# Patient Record
Sex: Female | Born: 1990 | Race: Black or African American | Hispanic: No | Marital: Single | Smoking: Never smoker
Health system: Southern US, Community
[De-identification: ages and names within clinical notes are randomized; demographics above are authoritative.]

## PROBLEM LIST (undated history)

## (undated) DIAGNOSIS — E669 Obesity, unspecified: Secondary | ICD-10-CM

---
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
## 2012-07-04 ENCOUNTER — Other Ambulatory Visit (HOSPITAL_COMMUNITY)
Admission: RE | Admit: 2012-07-04 | Discharge: 2012-07-04 | Disposition: A | Discharge status: Home / Self Care | Payer: Self-pay | Source: Ambulatory Visit | Attending: Family Medicine | Admitting: Family Medicine

## 2012-07-04 ENCOUNTER — Encounter (HOSPITAL_COMMUNITY): Payer: Self-pay | Admitting: Emergency Medicine

## 2012-07-04 ENCOUNTER — Emergency Department (INDEPENDENT_AMBULATORY_CARE_PROVIDER_SITE_OTHER)
Admission: EM | Admit: 2012-07-04 | Discharge: 2012-07-04 | Disposition: A | Discharge status: Home / Self Care | Payer: Self-pay | Source: Home / Self Care | Attending: Family Medicine | Admitting: Family Medicine

## 2012-07-04 DIAGNOSIS — B373 Candidiasis of vulva and vagina: Secondary | ICD-10-CM

## 2012-07-04 DIAGNOSIS — Z113 Encounter for screening for infections with a predominantly sexual mode of transmission: Secondary | ICD-10-CM | POA: Insufficient documentation

## 2012-07-04 DIAGNOSIS — N760 Acute vaginitis: Secondary | ICD-10-CM | POA: Insufficient documentation

## 2012-07-04 LAB — POCT URINALYSIS DIP (DEVICE)
Bilirubin Urine: NEGATIVE
Glucose, UA: NEGATIVE mg/dL
Hgb urine dipstick: NEGATIVE
Ketones, ur: NEGATIVE mg/dL
Specific Gravity, Urine: 1.02 (ref 1.005–1.030)

## 2012-07-04 LAB — GLUCOSE, CAPILLARY: Glucose-Capillary: 79 mg/dL (ref 70–99)

## 2012-07-04 MED ORDER — TERCONAZOLE 80 MG VA SUPP: 80.0000 mg | Freq: Every day | VAGINAL | Status: DC

## 2012-07-04 MED ORDER — FLUCONAZOLE 150 MG PO TABS: 150.0000 mg | ORAL_TABLET | Freq: Once | ORAL | Status: DC

## 2012-07-04 NOTE — ED Provider Notes (Signed)
History     CSN: [id number]  Arrival date & time 07/04/12  1620   First MD Initiated Contact with Patient 07/04/12 1642      Chief Complaint  Patient presents with  . Exposure to STD    unprotected intercourse  . Vaginal Discharge    vaginal irritation    (Consider location/radiation/quality/duration/timing/severity/associated sxs/prior treatment) Patient is a 22 y.o. female presenting with STD exposure and vaginal discharge. The history is provided by the patient.  Exposure to STD This is a new problem. The current episode started more than 2 days ago. The problem has been gradually worsening. Associated symptoms include abdominal pain.  Vaginal Discharge This is a new problem. The current episode started more than 2 days ago. The problem has been gradually worsening. Associated symptoms include abdominal pain.    History reviewed. No pertinent past medical history.  History reviewed. No pertinent past surgical history.  History reviewed. No pertinent family history.  History  Substance Use Topics  . Smoking status: Never Smoker   . Smokeless tobacco: Not on file  . Alcohol Use: Yes    OB History   Grav Para Term Preterm Abortions TAB SAB Ect Mult Living                  Review of Systems  Constitutional: Negative.   Gastrointestinal: Positive for abdominal pain. Negative for nausea, vomiting and diarrhea.  Genitourinary: Positive for vaginal discharge. Negative for vaginal bleeding, vaginal pain and pelvic pain.    Allergies  Review of patient's allergies indicates no known allergies.  Home Medications   Current Outpatient Rx  Name  Route  Sig  Dispense  Refill  . fluconazole (DIFLUCAN) 150 MG tablet   Oral   Take 1 tablet (150 mg total) by mouth once. May repeat in 1 week.   1 tablet   1   . terconazole (TERAZOL 3) 80 MG vaginal suppository   Vaginal   Place 1 suppository (80 mg total) vaginally at bedtime.   3 suppository   0     LMP  05/06/2012  Physical Exam  Nursing note and vitals reviewed. Constitutional: She is oriented to person, place, and time. She appears well-developed and well-nourished.  HENT:  Head: Normocephalic.  Abdominal: Soft. Bowel sounds are normal.  Genitourinary: Uterus normal.    Cervix exhibits no discharge. Right adnexum displays no mass and no tenderness. Left adnexum displays no mass and no tenderness. There is erythema around the vagina. No tenderness or bleeding around the vagina. Vaginal discharge found.  Neurological: She is alert and oriented to person, place, and time.  Skin: Skin is warm and dry.    ED Course  Procedures (including critical care time)  Labs Reviewed  POCT URINALYSIS DIP (DEVICE) - Abnormal; Notable for the following:    Urobilinogen, UA 4.0 (*)    Leukocytes, UA SMALL (*)    All other components within normal limits  GLUCOSE, CAPILLARY  POCT PREGNANCY, URINE  POCT PREGNANCY, URINE  CERVICOVAGINAL ANCILLARY ONLY   No results found.   1. Candida vaginitis       MDM          Linna Hoff, MD 07/05/12 [phone]

## 2012-07-04 NOTE — ED Notes (Signed)
Pt c/o vaginal irritation with a white discharge and denies odor. Pelvic and abdominal pain off/on. Pt request upt and std testing. Pt states that she cant remember date of last cycle

## 2012-07-05 ENCOUNTER — Telehealth (HOSPITAL_COMMUNITY): Payer: Self-pay | Admitting: *Deleted

## 2012-07-05 LAB — POCT PREGNANCY, URINE: Preg Test, Ur: NEGATIVE

## 2012-07-05 MED ORDER — AZITHROMYCIN 500 MG PO TABS: 1000.0000 mg | ORAL_TABLET | Freq: Once | ORAL | Status: DC

## 2012-07-05 NOTE — ED Notes (Signed)
I called pt.  Pt. verified x 2 and given results.  Pt. told she needs Zithromax for Chlamydia. I told her the vaginitis test is not back, but I would call her if anything else is positive.  Pt. instructed to notify her partner, no sex for 1 week and to practice safe sex. Pt. told they can get HIV testing at the [facility]D clinic, by appointment.  Rx. called to the [facility] on [address]. @ [phone].  DHHS form completed and faxed to the [facility].

## 2012-07-10 NOTE — ED Notes (Signed)
3/7 Affirm: Candida and Gardnerella pos. Trich neg. Pt. adequately treated with Diflucan for Candida.   Discussed results with Dr. Artis Flock and he said he was not going to treat the Gardnerella.   Cherly Anderson M

## 2012-07-11 ENCOUNTER — Telehealth (HOSPITAL_COMMUNITY): Payer: Self-pay | Admitting: *Deleted

## 2012-07-11 NOTE — ED Notes (Signed)
Pt. verified x 2 and given Affirm results. Pt. told she was adequately treated with Diflucan and Terazol supp. for Candida.  Pt. said her symptoms are gone. No vaginal discharge or itching now. Pt. told she does not need tx. for Gardnerella at this time since no symptoms.  Pt. instructed to get rechecked if symptoms recur. Pt. voiced understanding. Andrea Gill 07/11/2012

## 2012-08-22 ENCOUNTER — Encounter (HOSPITAL_COMMUNITY): Payer: Self-pay | Admitting: Nurse Practitioner

## 2012-08-22 ENCOUNTER — Emergency Department (HOSPITAL_COMMUNITY)
Admission: EM | Admit: 2012-08-22 | Discharge: 2012-08-22 | Disposition: A | Discharge status: Home / Self Care | Payer: Self-pay | Attending: Emergency Medicine | Admitting: Emergency Medicine

## 2012-08-22 DIAGNOSIS — K089 Disorder of teeth and supporting structures, unspecified: Secondary | ICD-10-CM | POA: Insufficient documentation

## 2012-08-22 DIAGNOSIS — K0889 Other specified disorders of teeth and supporting structures: Secondary | ICD-10-CM

## 2012-08-22 MED ORDER — AMOXICILLIN 500 MG PO CAPS: 1000.0000 mg | ORAL_CAPSULE | Freq: Two times a day (BID) | ORAL | Status: DC

## 2012-08-22 MED ORDER — HYDROCODONE-ACETAMINOPHEN 5-325 MG PO TABS: 1.0000 | ORAL_TABLET | ORAL | Status: DC | PRN

## 2012-08-22 MED ORDER — IBUPROFEN 800 MG PO TABS: 800.0000 mg | ORAL_TABLET | Freq: Three times a day (TID) | ORAL | Status: DC

## 2012-08-22 NOTE — ED Notes (Addendum)
Pt reports L and R sided toothache for past months, mild at first but no severe and radiating through jaw. Took otc meds with no relief

## 2012-08-22 NOTE — ED Provider Notes (Signed)
History    This chart was scribed for non-physician practitioner Elpidio Anis, PA-C working with Celene Kras, MD by Gerlean Ren, ED Scribe. This patient was seen in room TR11C/TR11C and the patient's care was started at 5:04 PM.   CSN: [id number]  Arrival date & time 08/22/12  1421   First MD Initiated Contact with Patient 08/22/12 1641      Chief Complaint  Patient presents with  . Dental Pain     The history is provided by the patient. No language interpreter was used.  Andrea Gill is a 22 y.o. female who presents to the Emergency Department complaining of left and right side severe dental pain that began worsening 4 days ago and is currently severe.  Pain has been mild and waxing-and-waning over the past 3 months.  Pt denies any fevers, chills, nausea, trouble swallowing.  Pt used OCM without relief to pain.      History reviewed. No pertinent past medical history.  History reviewed. No pertinent past surgical history.  History reviewed. No pertinent family history.  History  Substance Use Topics  . Smoking status: Never Smoker   . Smokeless tobacco: Not on file  . Alcohol Use: Yes    No OB history provided.   Review of Systems  Constitutional: Negative for fever and chills.  HENT: Positive for dental problem. Negative for facial swelling and trouble swallowing.   Gastrointestinal: Negative for nausea.  All other systems reviewed and are negative.    Allergies  Review of patient's allergies indicates no known allergies.  Home Medications   Current Outpatient Rx  Name  Route  Sig  Dispense  Refill  . acetaminophen (TYLENOL) 500 MG tablet   Oral   Take 1,000-1,500 mg by mouth every 4 (four) hours as needed for pain.         Josefa Half Peroxide (ORAJEL MOUTH-AID MT)   Mouth/Throat   Use as directed 1 application in the mouth or throat daily as needed (for tooth pain).         . cetirizine (ZYRTEC) 10 MG tablet   Oral   Take 10 mg by mouth  daily.         . diphenhydrAMINE (BENADRYL) 25 MG tablet   Oral   Take 25 mg by mouth every 6 (six) hours as needed for allergies.          Marland Kitchen ibuprofen (ADVIL,MOTRIN) 200 MG tablet   Oral   Take 600 mg by mouth every 4 (four) hours as needed for pain.         . Pseudoephedrine-APAP-DM (DAYQUIL MULTI-SYMPTOM COLD/FLU PO)   Oral   Take 1 tablet by mouth daily as needed (for cold).           BP 159/84  Pulse 90  Temp(Src) 97.9 F (36.6 C) (Oral)  Resp 14  SpO2 99%  Physical Exam  Nursing note and vitals reviewed. Constitutional: She is oriented to person, place, and time. She appears well-developed and well-nourished. No distress.  HENT:  Head: Normocephalic and atraumatic.  Eyes: EOM are normal.  Neck: Neck supple. No tracheal deviation present.  No adenopathy   Cardiovascular: Normal rate.   Pulmonary/Chest: Effort normal. No respiratory distress.  Musculoskeletal: Normal range of motion.  Neurological: She is alert and oriented to person, place, and time.  Skin: Skin is warm and dry.  Psychiatric: She has a normal mood and affect. Her behavior is normal.    ED Course  Procedures (including  critical care time) DIAGNOSTIC STUDIES: Oxygen Saturation is 99% on room air, normal by my interpretation.    COORDINATION OF CARE: 5:07 PM- Discussed pain treatment and antibiotics but that dentist appointment is necessary.  Pt verbalizes understanding and agrees with plan.    No diagnosis found.  1. Dental pain   MDM  Will treat with abx and pain relief. Refer to dentistry.      I personally performed the services described in this documentation, which was scribed in my presence. The recorded information has been reviewed and is accurate.     Arnoldo Hooker, PA-C 08/22/12 1722

## 2012-08-22 NOTE — ED Provider Notes (Signed)
Medical screening examination/treatment/procedure(s) were performed by non-physician practitioner and as supervising physician I was immediately available for consultation/collaboration.    Princessa Lesmeister R Jaleeyah Munce, MD 08/22/12 2344 

## 2012-10-31 ENCOUNTER — Emergency Department (HOSPITAL_COMMUNITY)
Admission: EM | Admit: 2012-10-31 | Discharge: 2012-10-31 | Disposition: A | Discharge status: Home / Self Care | Payer: Self-pay | Attending: Emergency Medicine | Admitting: Emergency Medicine

## 2012-10-31 ENCOUNTER — Encounter (HOSPITAL_COMMUNITY): Payer: Self-pay | Admitting: Emergency Medicine

## 2012-10-31 DIAGNOSIS — K029 Dental caries, unspecified: Secondary | ICD-10-CM | POA: Insufficient documentation

## 2012-10-31 DIAGNOSIS — K0889 Other specified disorders of teeth and supporting structures: Secondary | ICD-10-CM

## 2012-10-31 DIAGNOSIS — K089 Disorder of teeth and supporting structures, unspecified: Secondary | ICD-10-CM | POA: Insufficient documentation

## 2012-10-31 MED ORDER — PENICILLIN V POTASSIUM 500 MG PO TABS: 500.0000 mg | ORAL_TABLET | Freq: Three times a day (TID) | ORAL | Status: DC

## 2012-10-31 MED ORDER — NAPROXEN 500 MG PO TABS: 500.0000 mg | ORAL_TABLET | Freq: Two times a day (BID) | ORAL | Status: DC

## 2012-10-31 MED ORDER — HYDROCODONE-ACETAMINOPHEN 5-325 MG PO TABS: ORAL_TABLET | ORAL | Status: DC

## 2012-10-31 NOTE — ED Provider Notes (Signed)
History    CSN: [id number] Arrival date & time 10/31/12  1356  First MD Initiated Contact with Patient 10/31/12 1435     Chief Complaint  Patient presents with  . Dental Pain   (Consider location/radiation/quality/duration/timing/severity/associated sxs/prior Treatment) HPI Comments: Patient with L mandibular 1st molar pain x 2 days. Worsening. No facial swelling. Over-the-counter medications without relief. No difficulty breathing or trouble swallowing. Onset of symptoms gradual. Nothing makes symptoms better or worse.  Patient is a 22 y.o. female presenting with tooth pain. The history is provided by the patient.  Dental Pain Associated symptoms: no facial swelling, no fever, no headaches and no neck pain    History reviewed. No pertinent past medical history. History reviewed. No pertinent past surgical history. History reviewed. No pertinent family history. History  Substance Use Topics  . Smoking status: Never Smoker   . Smokeless tobacco: Not on file  . Alcohol Use: Yes   OB History   Grav Para Term Preterm Abortions TAB SAB Ect Mult Living                 Review of Systems  Constitutional: Negative for fever.  HENT: Positive for dental problem. Negative for ear pain, sore throat, facial swelling, trouble swallowing and neck pain.   Respiratory: Negative for shortness of breath and stridor.   Skin: Negative for color change.  Neurological: Negative for headaches.    Allergies  Apple; Banana; and Other  Home Medications   Current Outpatient Rx  Name  Route  Sig  Dispense  Refill  . acetaminophen (TYLENOL) 500 MG tablet   Oral   Take 1,000-1,500 mg by mouth every 4 (four) hours as needed for pain.         Josefa Half Peroxide (ORAJEL MOUTH-AID MT)   Mouth/Throat   Use as directed 1 application in the mouth or throat daily as needed (for tooth pain).         . cetirizine (ZYRTEC) 10 MG tablet   Oral   Take 10 mg by mouth daily.         .  diphenhydrAMINE (BENADRYL) 25 MG tablet   Oral   Take 25 mg by mouth every 6 (six) hours as needed for allergies.          Marland Kitchen ibuprofen (ADVIL,MOTRIN) 200 MG tablet   Oral   Take 600 mg by mouth every 4 (four) hours as needed for pain.          BP 123/80  Pulse 86  Temp(Src) 98 F (36.7 C) (Oral)  Resp 16  SpO2 100% Physical Exam  Nursing note and vitals reviewed. Constitutional: She appears well-developed and well-nourished.  HENT:  Head: Normocephalic and atraumatic. No trismus in the jaw.  Right Ear: Tympanic membrane, external ear and ear canal normal.  Left Ear: Tympanic membrane, external ear and ear canal normal.  Nose: Nose normal.  Mouth/Throat: Uvula is midline, oropharynx is clear and moist and mucous membranes are normal. Abnormal dentition. Dental caries present. No dental abscesses or edematous. No tonsillar abscesses.  Patient with L mandibular tooth pain and tenderness to palpation in area of L 1st molar. No swelling or erythema noted on exam.  Eyes: Conjunctivae are normal.  Neck: Normal range of motion. Neck supple.  No neck swelling or Ludwig's angina  Lymphadenopathy:    She has no cervical adenopathy.  Neurological: She is alert.  Skin: Skin is warm and dry.  Psychiatric: She has a normal mood and  affect.    ED Course  Procedures (including critical care time) Labs Reviewed - No data to display No results found. 1. Pain, dental     2:40 PM Patient seen and examined. Work-up initiated. Medications ordered.   Vital signs reviewed and are as follows: Filed Vitals:   10/31/12 1407  BP: 123/80  Pulse: 86  Temp: 98 F (36.7 C)  Resp: 16    Patient counseled to take prescribed medications as directed, return with worsening facial or neck swelling, and to follow-up with their dentist as soon as possible.    MDM  Patient with toothache.  No gross abscess.  Exam unconcerning for Ludwig's angina or other deep tissue infection in neck.  Will treat  with pain medicine and NSAID.  Penicilin rx in case no improvement or facial swelling develops. Urged patient to follow-up with dentist.  Referrals given.   Renne Crigler, PA-C 10/31/12 1515

## 2012-10-31 NOTE — ED Notes (Signed)
Pt c/o left lower and right upper dental pain; pt seen here for same in past

## 2012-10-31 NOTE — ED Provider Notes (Signed)
Medical screening examination/treatment/procedure(s) were performed by non-physician practitioner and as supervising physician I was immediately available for consultation/collaboration.  Rylah Fukuda, MD 10/31/12 2120 

## 2012-10-31 NOTE — ED Notes (Signed)
PA at bedside.

## 2013-12-30 ENCOUNTER — Encounter (HOSPITAL_COMMUNITY): Payer: Self-pay | Admitting: Emergency Medicine

## 2013-12-30 ENCOUNTER — Emergency Department (HOSPITAL_COMMUNITY)
Admission: EM | Admit: 2013-12-30 | Discharge: 2013-12-30 | Disposition: A | Discharge status: Home / Self Care | Payer: Self-pay | Attending: Emergency Medicine | Admitting: Emergency Medicine

## 2013-12-30 DIAGNOSIS — E669 Obesity, unspecified: Secondary | ICD-10-CM | POA: Insufficient documentation

## 2013-12-30 DIAGNOSIS — R21 Rash and other nonspecific skin eruption: Secondary | ICD-10-CM | POA: Insufficient documentation

## 2013-12-30 DIAGNOSIS — B86 Scabies: Secondary | ICD-10-CM | POA: Insufficient documentation

## 2013-12-30 HISTORY — DX: Obesity, unspecified: E66.9

## 2013-12-30 MED ORDER — PERMETHRIN 5 % EX CREA: TOPICAL_CREAM | CUTANEOUS | Status: DC

## 2013-12-30 MED ORDER — HYDROXYZINE HCL 25 MG PO TABS: 25.0000 mg | ORAL_TABLET | Freq: Four times a day (QID) | ORAL | Status: DC | PRN

## 2013-12-30 MED ORDER — TRIAMCINOLONE ACETONIDE 0.1 % EX CREA: 1.0000 "application " | TOPICAL_CREAM | Freq: Three times a day (TID) | CUTANEOUS | Status: DC | PRN

## 2013-12-30 NOTE — ED Notes (Signed)
PT reports rash started  2 months ago.

## 2013-12-30 NOTE — ED Provider Notes (Signed)
CSN: [id number]     Arrival date & time 12/30/13  1817 History  This chart was scribed for non-physician practitioner, Andrea Derry, PA-C working with Andrea Crease, MD by Andrea Gill, ED scribe. This patient was seen in room TR06C/TR06C and the patient's care was started at 7:57 PM.   Chief Complaint  Patient presents with  . Rash   Patient is a 23 y.o. female presenting with rash. The history is provided by the patient. No language interpreter was used.  Rash Location:  Hand, foot and shoulder/arm Shoulder/arm rash location:  L forearm, R forearm, L elbow and R elbow Hand rash location:  R hand and L hand Foot rash location:  L foot and R foot Quality: blistering, itchiness, scaling and weeping (when "squeezed")   Quality: not red   Severity:  Mild Onset quality:  Gradual Duration:  2 months Timing:  Constant Progression:  Spreading Chronicity:  New Context: new detergent/soap (new "black bar" soap)   Context: not animal contact, not exposure to similar rash, not food, not hot tub use, not insect bite/sting, not medications, not nuts, not plant contact, not pollen and not sick contacts   Relieved by:  Nothing Worsened by:  Nothing tried Ineffective treatments: Aveeno eczema cream. Associated symptoms: no abdominal pain, no fever, no induration, no joint pain, no myalgias, no nausea, no shortness of breath, no sore throat, no throat swelling, no tongue swelling, no URI, not vomiting and not wheezing    HPI Comments: Andrea Gill is a 23 y.o. female with history of eczema who presents to the Emergency Department complaining of an itchy rash to bilateral hands, arms and feet that started about 2 months ago. States it started on her toes and moved up her body. She doesn't think it ever got red or swollen. Reports a clear drainage from the rash intermittently when squeezed. Rash is worse at night. Pt thought it was an eczema flare up but states her sister now has  a similar rash. She has used Aveeno eczema with little relief. Pt started using a new soap and stayed in a hotel before the rash started. Denies new lotions, detergents, clothes, pets, furniture. Denies fever, tongue swelling, lip swelling, SOB, cough, wheezing, abdominal pain, nausea, emesis.   Past Medical History  Diagnosis Date  . Obesity    History reviewed. No pertinent past surgical history. History reviewed. No pertinent family history. History  Substance Use Topics  . Smoking status: Never Smoker   . Smokeless tobacco: Not on file  . Alcohol Use: Yes   OB History   Grav Para Term Preterm Abortions TAB SAB Ect Mult Living                 Review of Systems  Constitutional: Negative for fever.  HENT: Negative for facial swelling and sore throat.   Respiratory: Negative for cough, shortness of breath and wheezing.   Cardiovascular: Negative for chest pain.  Gastrointestinal: Negative for nausea, vomiting and abdominal pain.  Musculoskeletal: Negative for arthralgias, joint swelling, myalgias, neck pain and neck stiffness.  Skin: Positive for rash. Negative for color change.  Neurological: Negative for numbness.  10 Systems reviewed and are negative for acute change except as noted in the HPI.  Allergies  Apple; Banana; and Other  Home Medications   Prior to Admission medications   Medication Sig Start Date End Date Taking? Authorizing Provider  hydrOXYzine (ATARAX/VISTARIL) 25 MG tablet Take 1 tablet (25 mg total) by mouth every 6 (  six) hours as needed for itching. 12/30/13   Andrea Yono Strupp Camprubi-Soms, PA-C  permethrin (ELIMITE) 5 % cream Apply a generous amount of cream from head to feet, leave on for 8 to 14 hours, then wash off with soap/water 12/30/13   Andrea Falls Camprubi-Soms, PA-C  triamcinolone cream (KENALOG) 0.1 % Apply 1 application topically 3 (three) times daily as needed. For itching 12/30/13   Andrea Graul Strupp Camprubi-Soms, PA-C   BP 120/83  Pulse  83  Temp(Src) 98.7 F (37.1 C) (Oral)  Resp 18  SpO2 100%  LMP 11/07/2013  Physical Exam  Nursing note and vitals reviewed. Constitutional: She is oriented to person, place, and time. Vital signs are normal. She appears well-developed and well-nourished. No distress.  Afebrile nontoxic NAD  HENT:  Head: Normocephalic and atraumatic.  Mouth/Throat: Mucous membranes are normal.  Eyes: Conjunctivae and EOM are normal.  Neck: Normal range of motion. Neck supple.  Cardiovascular: Normal rate.   Pulmonary/Chest: Effort normal. No stridor. No respiratory distress.  Abdominal: Normal appearance. She exhibits no distension.  Musculoskeletal: Normal range of motion.  Neurological: She is alert and oriented to person, place, and time. She has normal strength. No sensory deficit.  Skin: Skin is warm, dry and intact. Rash noted. Rash is pustular.  Somewhat pustular rash over entire body with most prominent areas in interdigital web spaces, +burrowing. No erythema or drainage. No vesicles. No scaling.   Psychiatric: She has a normal mood and affect. Her behavior is normal.    ED Course  Procedures (including critical care time)  DIAGNOSTIC STUDIES: Oxygen Saturation is 100% on RA, normal by my interpretation.    COORDINATION OF CARE: 8:05 PM-Discussed treatment plan which includes permethrin cream with pt at bedside and pt agreed to plan. Advised pt to wash her clothes and bedding in hot water.   Labs Review Labs Reviewed - No data to display  Imaging Review No results found.   EKG Interpretation None      MDM   Final diagnoses:  Scabies  Rash    23y/o female with rash, appears like scabies, sister here for same. Interdigital web spaces affected, worse at night, and +burrowing seen. Given triamcinolone, vistaril, and permethrin. Discussed washing linens in hot water and enveloping fabrics x48hrs. Discussed f/up with PCP from resource guide. I explained the diagnosis and have  given explicit precautions to return to the ER including for any other new or worsening symptoms. The patient understands and accepts the medical plan as it's been dictated and I have answered their questions. Discharge instructions concerning home care and prescriptions have been given. The patient is STABLE and is discharged to home in good condition.   I personally performed the services described in this documentation, which was scribed in my presence. The recorded information has been reviewed and is accurate.  BP 120/83  Pulse 83  Temp(Src) 98.7 F (37.1 C) (Oral)  Resp 18  SpO2 100%  LMP 11/07/2013  Meds ordered this encounter  Medications  . triamcinolone cream (KENALOG) 0.1 %    Sig: Apply 1 application topically 3 (three) times daily as needed. For itching    Dispense:  30 g    Refill:  0    Order Specific Question:  Supervising Provider    Answer:  Eber Hong D [[id number]]  . hydrOXYzine (ATARAX/VISTARIL) 25 MG tablet    Sig: Take 1 tablet (25 mg total) by mouth every 6 (six) hours as needed for itching.    Dispense:  28 tablet    Refill:  0    Order Specific Question:  Supervising Provider    Answer:  Eber Hong D [[id number]]  . permethrin (ELIMITE) 5 % cream    Sig: Apply a generous amount of cream from head to feet, leave on for 8 to 14 hours, then wash off with soap/water    Dispense:  60 g    Refill:  0    Order Specific Question:  Supervising Provider    Answer:  Vida Roller [address] [place] Garrie Elenes Camprubi-Soms, PA-C 12/30/13 2049

## 2013-12-30 NOTE — Discharge Instructions (Signed)
Take vistaril for itching, and use triamcinolone cream as directed for itching, as well as apply permethrin lotion as prescribed to eradicate your scabies infection. Continue your usual home medications. Get plenty of rest and drink plenty of fluids. You must wash all linens with hot water, and wrap any fabric items that can't be washed in a plastic bag with a seal for 48hrs. All family members will need to do this. Please followup with your primary doctor/a doctor from the list below for discussion of your diagnoses and further evaluation after today's visit; if you do not have a primary care doctor use the resource guide provided to find one; return to the ER for worsening or changes in symptoms.   Rash A rash is a change in the color or texture of your skin. There are many different types of rashes. You may have other problems that accompany your rash. CAUSES   Infections.  Allergic reactions. This can include allergies to pets or foods.  Certain medicines.  Exposure to certain chemicals, soaps, or cosmetics.  Heat.  Exposure to poisonous plants.  Tumors, both cancerous and noncancerous. SYMPTOMS   Redness.  Scaly skin.  Itchy skin.  Dry or cracked skin.  Bumps.  Blisters.  Pain. DIAGNOSIS  Your caregiver may do a physical exam to determine what type of rash you have. A skin sample (biopsy) may be taken and examined under a microscope. TREATMENT  Treatment depends on the type of rash you have. Your caregiver may prescribe certain medicines. For serious conditions, you may need to see a skin doctor (dermatologist). HOME CARE INSTRUCTIONS   Avoid the substance that caused your rash.  Do not scratch your rash. This can cause infection.  You may take cool baths to help stop itching.  Only take over-the-counter or prescription medicines as directed by your caregiver.  Keep all follow-up appointments as directed by your caregiver. SEEK IMMEDIATE MEDICAL CARE IF:  You  have increasing pain, swelling, or redness.  You have a fever.  You have new or severe symptoms.  You have body aches, diarrhea, or vomiting.  Your rash is not better after 3 days. MAKE SURE YOU:  Understand these instructions.  Will watch your condition.  Will get help right away if you are not doing well or get worse. Document Released: 04/09/2002 Document Revised: 07/12/2011 Document Reviewed: 02/01/2011 [facility] Patient Information 2015 [place], [place]. This information is not intended to replace advice given to you by your health care provider. Make sure you discuss any questions you have with your health care provider.  Scabies Scabies are small bugs (mites) that burrow under the skin and cause red bumps and severe itching. These bugs can only be seen with a microscope. Scabies are highly contagious. They can spread easily from person to person by direct contact. They are also spread through sharing clothing or linens that have the scabies mites living in them. It is not unusual for an entire family to become infected through shared towels, clothing, or bedding.  HOME CARE INSTRUCTIONS   Your caregiver may prescribe a cream or lotion to kill the mites. If cream is prescribed, massage the cream into the entire body from the neck to the bottom of both feet. Also massage the cream into the scalp and face if your child is less than 20 year old. Avoid the eyes and mouth. Do not wash your hands after application.  Leave the cream on for 8 to 12 hours. Your child should bathe or  shower after the 8 to 12 hour application period. Sometimes it is helpful to apply the cream to your child right before bedtime.  One treatment is usually effective and will eliminate approximately 95% of infestations. For severe cases, your caregiver may decide to repeat the treatment in 1 week. Everyone in your household should be treated with one application of the cream.  New rashes or burrows should not appear  within 24 to 48 hours after successful treatment. However, the itching and rash may last for 2 to 4 weeks after successful treatment. Your caregiver may prescribe a medicine to help with the itching or to help the rash go away more quickly.  Scabies can live on clothing or linens for up to 3 days. All of your child's recently used clothing, towels, stuffed toys, and bed linens should be washed in hot water and then dried in a dryer for at least 20 minutes on high heat. Items that cannot be washed should be enclosed in a plastic bag for at least 3 days.  To help relieve itching, bathe your child in a cool bath or apply cool washcloths to the affected areas.  Your child may return to school after treatment with the prescribed cream. SEEK MEDICAL CARE IF:   The itching persists longer than 4 weeks after treatment.  The rash spreads or becomes infected. Signs of infection include red blisters or yellow-tan crust. Document Released: 04/19/2005 Document Revised: 07/12/2011 Document Reviewed: 08/28/2008 [facility] Patient Information 2015 [place], [place]. This information is not intended to replace advice given to you by your health care provider. Make sure you discuss any questions you have with your health care provider.  Emergency Department Resource Guide 1) Find a Doctor and Pay Out of Pocket Although you won't have to find out who is covered by your insurance plan, it is a good idea to ask around and get recommendations. You will then need to call the office and see if the doctor you have chosen will accept you as a new patient and what types of options they offer for patients who are self-pay. Some doctors offer discounts or will set up payment plans for their patients who do not have insurance, but you will need to ask so you aren't surprised when you get to your appointment.  2) Contact Your Local Health Department Not all health departments have doctors that can see patients for sick visits, but  many do, so it is worth a call to see if yours does. If you don't know where your local health department is, you can check in your phone book. The CDC also has a tool to help you locate your state's health department, and many state websites also have listings of all of their local health departments.  3) Find a Walk-in Clinic If your illness is not likely to be very severe or complicated, you may want to try a walk in clinic. These are popping up all over the country in pharmacies, drugstores, and shopping centers. They're usually staffed by nurse practitioners or physician assistants that have been trained to treat common illnesses and complaints. They're usually fairly quick and inexpensive. However, if you have serious medical issues or chronic medical problems, these are probably not your best option.  No Primary Care Doctor: - Call Health Connect at  [phone] - they can help you locate a primary care doctor that  accepts your insurance, provides certain services, etc. - Physician Referral Service- [phone]  Chronic Pain Problems: Organization  Address  Phone   Notes  [facility]  [phone] Patients need to be referred by their primary care doctor.   Medication Assistance: Organization         Address  Phone   Notes  [facility] Medication [facility] [place] [place], [place] [address] [phone] --Must be a resident of [facility] -- Must have NO insurance coverage whatsoever (no Medicaid/ Medicare, etc.) -- The pt. MUST have a primary care doctor that directs their care regularly and follows them in the community   MedAssist  [phone]   [employer]  [phone]    Agencies that provide inexpensive medical care: Organization         Address  Phone   Notes  [place]  [phone]   [facility]    [phone]   [facility] [place], [place] [address] [phone]   [place] [address] [place] [phone]   Planned Parenthood    [phone]   [facility]    [phone]   [place] and [address], [place] Phone:  [phone], Fax:  [phone] Hours of Operation:  9 am - 6 pm, M-F.  Also accepts Medicaid/Medicare and self-pay.  [facility] for [place], [address], [place] Phone: [phone], Fax: [phone]. Hours of Operation:  8:30 am - 5:30 pm, M-F.  Also accepts Medicaid and self-pay.  [facility] [place] [address], [place] Phone: [phone]   [place], [place], [place] [phone], Ext. [phone] Mondays & Thursdays: 7-9 AM.  First 15 patients are seen on a first come, first serve basis.    [place] Providers:  Organization         Address  Phone   Notes  [facility] [address], [place] [phone] Also accepts self-pay patients.  [facility] [id number] [place], [place]  [phone]   [place], [address], [place] [phone]   [facility] [address], [place] [phone]   Lucianne Lei [address], [place]   [phone] Only accepts [place] Access [place] patients after they have their name applied to their card.   Self-Pay (no insurance) in [facility]:  Organization         Address  Phone   Notes  Sickle Cell Patients, [facility] Internal Medicine [place] [phone]   [facility] [place] [phone]   [facility] [place]  [place], [place], [place] [phone]   [facility]/Dr. Osei-Bonsu  [address] [place] or  [address], [place] [phone] Phone number for both [place] and [place] locations is the same.  [facility] [address], [place] [phone]   [facility] [address], [place] or [address] [phone]   [facility] [address], [place] [phone], phone; [phone], fax Sees patients 1st and 3rd Saturday of every month.  Must not qualify  for public or private insurance (i.e. Medicaid, Medicare, [place] Health Choice, Veterans' Benefits)  Household income should be no more than 200% of the poverty level The clinic cannot treat you if you are pregnant or think you are pregnant  Sexually transmitted diseases are not treated at the clinic.    Dental Care: Organization         Address  Phone  Notes  [facility] [place] [phone] Accepts children up to age 21 who are enrolled in [place] or [place]; pregnant women with a Medicaid card; and children who have applied for Medicaid or [place] Health Choice, but were declined, whose parents can pay a reduced fee at time of service.  [facility] Department of [facility]  [address], [place] [phone] Accepts children up to age 36 who are enrolled in [place] or [place]; pregnant women with a Medicaid card; and children who have applied for Medicaid or [place] Health Choice, but were declined, whose parents can pay a reduced fee at time of service.  [place] Adult Dental Access PROGRAM  [place] [phone] Patients are seen by appointment only. Walk-ins are not accepted. [place] will see patients 54 years of age and older. Monday - Tuesday (8am-5pm) Most Wednesdays (8:30-5pm) $30 per visit, cash only  [facility] Adult Dental Access PROGRAM  [address], [facility] [phone] Patients are seen by appointment only. Walk-ins are not accepted. [place] will see patients 75 years of age and older. One Wednesday Evening (Monthly: Volunteer Based).  $30 per visit, cash only  [place]  [phone] for adults; Children under age 42, call Graduate Pediatric Dentistry at [phone]. Children aged 90-14, please call [phone] to request a pediatric application.  Dental services are provided in all areas of dental care including fillings, crowns and bridges, complete and partial dentures, implants, gum treatment, root canals, and extractions. Preventive care is also provided. Treatment is provided to both adults and children. Patients are selected via a lottery and there is often a waiting list.   [facility] [address] [place]  [phone] [email or web address]   [facility] [address] [place], [place] [phone], Ext. [phone] Second and Fourth Thursday of each month, opens at 6:30 AM; Clinic ends at 9 AM.  Patients are seen on a first-come first-served basis, and a limited number are seen during each clinic.   [facility]  [address] Hillard Danker [place], [place] [phone]   Eligibility Requirements You must have lived in [place], [place], or [place] counties for at least the last three months.   You cannot be eligible for state or federal sponsored [employer], including [employer], [place], or [employer].   You generally cannot be eligible for healthcare insurance through your employer.    How to apply: Eligibility screenings are held every Tuesday and Wednesday afternoon from 1:00 pm until 4:00 pm. You do not need an appointment for the interview!  [facility] [address] [place], [place]   [place]  [place] Department  [place]  [phone]    Behavioral Health Resources in the Community: Intensive Outpatient Programs Organization         Address  Phone  Notes  [facility] ([place] [address], [place], [place] [phone]   [facility] Outpatient [address], [place], [place]   ADS: [facility] Drug [facility] [address], [place], [place]   [place] [address],  [place], [place] or [phone]   Substance Abuse Resources Organization         Address  Phone  Notes  Alcohol and Drug Services  [phone])456-6974   [place]  [phone]   [facility] [place]   Chinita Pester  [phone]   Residential & Outpatient Substance Abuse Program  [phone]   Psychological Services Organization         Address  Phone  Notes  [facility] [place]  [place]  [phone]   [place] [address], [place] or [phone]    Mobile Crisis Teams Organization         Address  Phone  Notes  [facility], Mobile Crisis Care Unit  [phone]   [facility]  [address] [place], [place]   [place] [address] [place], [place] [phone]    Self-Help/Support Groups Organization         Address  Phone             Notes  [place]. of [place] - variety of support groups  [place] Call for more information  Narcotics Anonymous (NA), [facility] [address], [employer] [place]  2 meetings at this location   Special educational needs teacher         Address  Phone  Notes  ASAP Residential Treatment [place],    [place]  [phone]   [facility]  [address], [place] [id number], [place], [place]   [place], [place] [phone]  Admissions: 8am-3pm M-F  Incentives Substance [place] [address],    [place], [place] [phone]   [facility] [address] [place], [place], [place]   [facility] [address]  [place], [place]   [facility]ight Programs - Intensive Outpatient [address], [place], [place]   [facility] [address].,  [place], [place] [phone] or [phone]   [facility] Services (RTS) [address] [place], [place] Accepts Medicaid  [facility] [place] [address]  [place] [phone] Substance Abuse/Addiction Treatment   [facility] Organization         Address  Phone  Notes  [facility]  [phone]   Domenic Schwab, PhD [address]lis [place], [place]   [phone] or [phone]   [place]   [address] [place], [place] [phone]   [facility] [address] [place], [place] [phone] Insurance/Medicaid/sponsorship through [employer] [facility] [place] and Families [address] [place]                                    [place], [place] [phone] [place] [address], [place] [phone]    Dr. Adele Schilder  [phone]   [facility] 1) [address], [place] °2) [address], [place] °3)  [address], [place] [phone] °([phone] ° °([phone]   °[place] Child Abuse Hotline (336) 342-1394 or (336) [phone] (After Hours)    ° ° ° ° °

## 2013-12-30 NOTE — ED Provider Notes (Signed)
Medical screening examination/treatment/procedure(s) were performed by non-physician practitioner and as supervising physician I was immediately available for consultation/collaboration.   EKG Interpretation None        Gilda Crease, MD 12/30/13 2337

## 2013-12-30 NOTE — ED Notes (Signed)
Pt has rash and itching, at first thought it was her exzema but now sister has it too, rash to hands and feet.

## 2014-01-22 ENCOUNTER — Emergency Department (HOSPITAL_COMMUNITY)
Admission: EM | Admit: 2014-01-22 | Discharge: 2014-01-22 | Disposition: A | Discharge status: Home / Self Care | Payer: Self-pay | Attending: Emergency Medicine | Admitting: Emergency Medicine

## 2014-01-22 ENCOUNTER — Encounter (HOSPITAL_COMMUNITY): Payer: Self-pay | Admitting: Emergency Medicine

## 2014-01-22 DIAGNOSIS — N912 Amenorrhea, unspecified: Secondary | ICD-10-CM | POA: Insufficient documentation

## 2014-01-22 DIAGNOSIS — E669 Obesity, unspecified: Secondary | ICD-10-CM | POA: Insufficient documentation

## 2014-01-22 DIAGNOSIS — Z79899 Other long term (current) drug therapy: Secondary | ICD-10-CM | POA: Insufficient documentation

## 2014-01-22 DIAGNOSIS — IMO0002 Reserved for concepts with insufficient information to code with codable children: Secondary | ICD-10-CM | POA: Insufficient documentation

## 2014-01-22 DIAGNOSIS — Z3202 Encounter for pregnancy test, result negative: Secondary | ICD-10-CM | POA: Insufficient documentation

## 2014-01-22 DIAGNOSIS — R1084 Generalized abdominal pain: Secondary | ICD-10-CM | POA: Insufficient documentation

## 2014-01-22 LAB — COMPREHENSIVE METABOLIC PANEL
ALBUMIN: 3.8 g/dL (ref 3.5–5.2)
ALK PHOS: 88 U/L (ref 39–117)
ALT: 14 U/L (ref 0–35)
ANION GAP: 11 (ref 5–15)
AST: 19 U/L (ref 0–37)
BILIRUBIN TOTAL: 0.3 mg/dL (ref 0.3–1.2)
BUN: 11 mg/dL (ref 6–23)
CHLORIDE: 103 meq/L (ref 96–112)
CO2: 28 mEq/L (ref 19–32)
Calcium: 9.5 mg/dL (ref 8.4–10.5)
Creatinine, Ser: 0.78 mg/dL (ref 0.50–1.10)
GFR calc Af Amer: >90 mL/min (ref >90)
GFR calc non Af Amer: >90 mL/min (ref >90)
Glucose, Bld: 85 mg/dL (ref 70–99)
POTASSIUM: 4.1 meq/L (ref 3.7–5.3)
Sodium: 142 mEq/L (ref 137–147)
Total Protein: 7.9 g/dL (ref 6.0–8.3)

## 2014-01-22 LAB — CBC WITH DIFFERENTIAL/PLATELET
BASOS PCT: 1 % (ref 0–1)
Basophils Absolute: 0 10*3/uL (ref 0.0–0.1)
Eosinophils Absolute: 0.2 10*3/uL (ref 0.0–0.7)
Eosinophils Relative: 3 % (ref 0–5)
HEMATOCRIT: 33.3 % — AB (ref 36.0–46.0)
Hemoglobin: 10.8 g/dL — ABNORMAL LOW (ref 12.0–15.0)
LYMPHS ABS: 2.2 10*3/uL (ref 0.7–4.0)
LYMPHS PCT: 33 % (ref 12–46)
MCH: 24.9 pg — AB (ref 26.0–34.0)
MCHC: 32.4 g/dL (ref 30.0–36.0)
MCV: 76.9 fL — AB (ref 78.0–100.0)
MONOS PCT: 7 % (ref 3–12)
Monocytes Absolute: 0.4 10*3/uL (ref 0.1–1.0)
Neutro Abs: 3.7 10*3/uL (ref 1.7–7.7)
Neutrophils Relative %: 56 % (ref 43–77)
PLATELETS: 288 10*3/uL (ref 150–400)
RBC: 4.33 MIL/uL (ref 3.87–5.11)
RDW: 15.5 % (ref 11.5–15.5)
WBC: 6.6 10*3/uL (ref 4.0–10.5)

## 2014-01-22 LAB — URINALYSIS, ROUTINE W REFLEX MICROSCOPIC
Bilirubin Urine: NEGATIVE
GLUCOSE, UA: NEGATIVE mg/dL
Hgb urine dipstick: NEGATIVE
Ketones, ur: NEGATIVE mg/dL
LEUKOCYTES UA: NEGATIVE
NITRITE: NEGATIVE
Protein, ur: NEGATIVE mg/dL
SPECIFIC GRAVITY, URINE: 1.022 (ref 1.005–1.030)
Urobilinogen, UA: 1 mg/dL (ref 0.0–1.0)
pH: 6 (ref 5.0–8.0)

## 2014-01-22 LAB — POC URINE PREG, ED: Preg Test, Ur: NEGATIVE

## 2014-01-22 NOTE — Discharge Instructions (Signed)
Be sure to follow the directions provided.  Be sure to establish care and follow up with your primary care provider.  You will also need to follow up with an OB/GYN for your irregular periods.    SEEK MEDICAL CARE IF:  You have unexplained abdominal pain.  You have abdominal pain associated with nausea or diarrhea.  You have pain when you urinate or have a bowel movement.  You experience abdominal pain that wakes you in the night.  You have abdominal pain that is worsened or improved by eating food.  You have abdominal pain that is worsened with eating fatty foods.  You have a fever. SEEK IMMEDIATE MEDICAL CARE IF:  Your pain does not go away within 2 hours.  You keep throwing up (vomiting).  Your pain is felt only in portions of the abdomen, such as the right side or the left lower portion of the abdomen.  You pass bloody or black tarry stools.    Emergency Department Resource Guide 1) Find a Doctor and Pay Out of Pocket Although you won't have to find out who is covered by your insurance plan, it is a good idea to ask around and get recommendations. You will then need to call the office and see if the doctor you have chosen will accept you as a new patient and what types of options they offer for patients who are self-pay. Some doctors offer discounts or will set up payment plans for their patients who do not have insurance, but you will need to ask so you aren't surprised when you get to your appointment.  2) Contact Your Local Health Department Not all health departments have doctors that can see patients for sick visits, but many do, so it is worth a call to see if yours does. If you don't know where your local health department is, you can check in your phone book. The CDC also has a tool to help you locate your state's health department, and many state websites also have listings of all of their local health departments.  3) Find a Walk-in Clinic If your illness is not likely to be  very severe or complicated, you may want to try a walk in clinic. These are popping up all over the country in pharmacies, drugstores, and shopping centers. They're usually staffed by nurse practitioners or physician assistants that have been trained to treat common illnesses and complaints. They're usually fairly quick and inexpensive. However, if you have serious medical issues or chronic medical problems, these are probably not your best option.  No Primary Care Doctor: - Call Health Connect at  [phone] - they can help you locate a primary care doctor that  accepts your insurance, provides certain services, etc. - Physician Referral Service- [phone]  Chronic Pain Problems: Organization         Address  Phone   Notes  [facility]  [phone] Patients need to be referred by their primary care doctor.   Medication Assistance: Organization         Address  Phone   Notes  [facility] Medication [facility] [address] [place] [place], [place] [address] [phone] --Must be a resident of [facility] -- Must have NO insurance coverage whatsoever (no Medicaid/ Medicare, etc.) -- The pt. MUST have a primary care doctor that directs their care regularly and follows them in the community   [facility]ist  [phone]   [address]  [phone]  Agencies that provide inexpensive medical care: Organization         Address  Phone   Notes  [facility]  [phone]) [phone]   [facility]    [phone]   [facility] [address] [place], [place] [phone]) [phone]   [facility] [place] [address], [place] [phone]) [phone]   Planned Parenthood    [phone](336) [phone]   [facility]    [phone]) [phone]   [facility]  [address], [place] Phone:  [phone], Fax:  [phone](336) [phone] Hours of Operation:  9 am - 6 pm, M-F.  Also  accepts Medicaid/Medicare and self-pay.  [facility]  [address], [place] Phone: [phone]) [phone], Fax: [phone]) [phone]. Hours of Operation:  8:30 am - 5:30 pm, M-F.  Also accepts Medicaid and self-pay.  [facility] [place] [address], [place] Phone: [phone]) [phone]   [facility] [address] [place] [address], [place], [place] [phone], Ext. [phone] Mondays & Thursdays: 7-9 AM.  First 15 patients are seen on a first come, first serve basis.    Medicaid-accepting [facility] Providers:  Organization         Address  Phone   Notes  [facility] [address], [place] [phone] Also accepts self-pay patients.  [facility] [address], [place]  [phone]) [phone]   [facility] [address], [place] [phone]) [phone]   [facility] [address] [place] [address], [place] [phone]) [phone]   Renaye RakersVeita Bland [address], [place]   [phone] Only accepts [place] Access [place] patients after they have their name applied to their card.   Self-Pay (no insurance) in [place]:  Organization         Address  Phone   Notes  Sickle Cell Patients, [facility] [address], [place] [phone]) [phone]   [facility] [address], [place] [phone]) [phone]   [facility] [place]  [address], [place] [phone]) [phone]   [facility]/Dr. Osei-Bonsu  [address] [place] [address], [place] or [phone], [place] [phone] Phone number for both [place] and [place] locations is the same.  [facility] [address] [place] [address], [place] [phone]) [phone]   [facility] [place] [address] [place] [address], [place] or [address] [phone]   [facility] [address],  [place] [phone]) [phone], phone; [phone]) [phone], fax Sees patients 1st and 3rd Saturday of every month.  Must not qualify for public or private insurance (i.e. Medicaid, Medicare, [place] Health Choice, Veterans' Benefits)  Household income should be no more than 200% of the poverty level The clinic cannot treat you if you are pregnant or think you are pregnant  Sexually transmitted diseases are not treated at the clinic.    Dental Care: Organization         Address  Phone  Notes  [facility] [address], [place] [phone]) [phone] Accepts children up to age 22 who are enrolled in [place] or [place] Health Choice; pregnant women with a Medicaid card; and children who have applied for Medicaid or  [place] Health Choice, but were declined, whose parents can pay a reduced fee at time of service.  [facility]  [address], [place] [phone] Accepts children up to age 62 who are enrolled in [place] or [place] Health Choice; pregnant women with a Medicaid card; and children who have applied for Medicaid or [place] Health Choice, but were declined, whose parents can pay a reduced fee at time of service.  [facility]  [address] [place], [place] [phone] Patients are seen by appointment only. Walk-ins are not accepted. [facility] will see patients 62 years of age and older. Monday - Tuesday (8am-5pm) Most Wednesdays (8:30-5pm) $30 per visit, cash only  [facility] Adult Dental Access PROGRAM  [address], [facility] [phone] Patients are seen by appointment only. Walk-ins are not accepted. [facility] will see patients 62 years of age and older. One Wednesday Evening (Monthly: Volunteer Based).  $30 per visit, cash only  [employer]  [phone] for adults; Children under age 57, call Graduate Pediatric Dentistry at [phone].  Children aged 52-14, please call [phone] to request a pediatric application.  Dental services are provided in all areas of dental care including fillings, crowns and bridges, complete and partial dentures, implants, gum treatment, root canals, and extractions. Preventive care is also provided. Treatment is provided to both adults and children. Patients are selected via a lottery and there is often a waiting list.   [facility] [address], [place]  [phone] [email or web address]   [facility] [address] [place], [place] [phone], Ext. [phone] Second and Fourth Thursday of each month, opens at 6:30 AM; Clinic ends at 9 AM.  Patients are seen on a first-come first-served basis, and a limited number are seen during each clinic.   [facility]  [address] [place], [place] [phone]   Eligibility Requirements You must have lived in [place], [place], or [place] counties for at least the last three months.   You cannot be eligible for state or federal sponsored [place], including [employer], [place], or [employer].   You generally cannot be eligible for healthcare insurance through your employer.    How to apply: Eligibility screenings are held every Tuesday and Wednesday afternoon from 1:00 pm until 4:00 pm. You do not need an appointment for the interview!  [facility] [address], [place], [place] [phone]   [facility] Health Department  [phone]   [facility] Health Department  [phone]   [facility] Health Department  [phone]    Behavioral Health Resources in the Community: Intensive Outpatient Programs Organization         Address  Phone  Notes  [facility] [address] [place], [place] [phone]   [facility] Outpatient [address], [place], [place] [phone]   [facility] [address] [place], [place]  [phone]   [facility] Mental Health [address],  [place], [place] [phone] or [phone]   Substance Abuse Resources Organization         Address  Phone  Notes  Al[facility] Drug Services  [phone]   [facility]  [phone]   [facility]  [phone]   Floydene Flock  [phone]   Residential & Outpatient Substance Abuse Program  [phone]  Psychological Services Organization         Address  Phone  Notes  [facility] Behavioral Health  [phone]   [facility] Services  [phone]   [facility] Mental Health [phone] N[address], [place] [phone] or 2522083449    Mobile Crisis Teams Organization         Address  Phone  Notes  [facility], Mobile Crisis Care Unit  [phone]   [facility]  [address] [place], [place] [phone]   Doristine Locks [address] [place] [phone]    Self-Help/Support Groups Organization         Address  Phone             Notes  [facility] [place] - variety of support groups  336- I7437963 Call for more information  Narcotics Anonymous (NA), [facility] [address], [employer] [place]  2 meetings at this location   Statistician         Address  Phone  Notes  [facility] [address],    [place]  [phone]   [facility]  [address] [place] [address], [place], [place] [phone]   [facility] [address] [place], [place] [phone] Admissions: 8am-3pm M-F  [facility] [address],    [place], [place] [phone]   [facility] [address] [place], [place], [place] [phone]   [facility] [address],  [place], [place] [phone]   [facility] - Intensive Outpatient [address], [place], [place] [phone]     [facility] [address] [place].,  [place], [place] [phone] or [phone]   [facility] ([facility]) [address] [place], [place] [phone] Accepts Medicaid  [facility] [place] [address],  [place] [phone] Substance Abuse/Addiction Treatment   [facility] Organization         Address  Phone  Notes  [facility]  [phone]   Angie Fava, PhD [address] Kn[address] [place], [place]   [phone] or [phone]   [facility]   [address] [place], [place] [phone]   [facility] [address], [place], [place] [phone] Insurance/Medicaid/sponsorship through [facility] and Families [address]                                    [place], [place] [phone] Therapy/tele-psych/case  [facility] [address], [place] [phone]    Dr. Lolly Mustache  [phone]   [facility]  [employer] [facility] Dept. 1) [address], [place] 2) [address], [place] 3)  [address], [place] [phone]  [phone]   [facility] Child Abuse Hotline (479)494-7673 or [phone] (After Hours)

## 2014-01-22 NOTE — ED Notes (Signed)
Generalized abd. Pain. Nauseated at last night. lmp in July 2015. Sexually active.

## 2014-01-22 NOTE — ED Provider Notes (Signed)
CSN: [id number]     Arrival date & time 01/22/14  1518 History   First MD Initiated Contact with Patient 01/22/14 1901     Chief Complaint  Patient presents with  . Abdominal Pain   (Consider location/radiation/quality/duration/timing/severity/associated sxs/prior Treatment) HPI Andrea Gill is a 23 yo female presenting to the ED with abd pain x 2 weeks. She reports the pain is intermittent and sharp and around her belly button.  She states initially the pain was not every day but now it is becoming more frequent.  Her LMP was July 8 and lasted 5 days. She has taken a home pregnancy and it was negative.  She is currently sexually active with one partner and denies ever having an STI.  She reports some nausea, but denies vomiting, diarrhea, constipation, fevers or chills.  She is currently pain free.  Past Medical History  Diagnosis Date  . Obesity    History reviewed. No pertinent past surgical history. History reviewed. No pertinent family history. History  Substance Use Topics  . Smoking status: Never Smoker   . Smokeless tobacco: Not on file  . Alcohol Use: Yes   OB History   Grav Para Term Preterm Abortions TAB SAB Ect Mult Living                 Review of Systems  Constitutional: Negative for fever and chills.  HENT: Negative for sore throat.   Eyes: Negative for visual disturbance.  Respiratory: Negative for cough and shortness of breath.   Cardiovascular: Negative for chest pain and leg swelling.  Gastrointestinal: Positive for abdominal pain. Negative for nausea, vomiting and diarrhea.  Genitourinary: Positive for menstrual problem. Negative for dysuria.  Musculoskeletal: Negative for myalgias.  Skin: Negative for rash.  Neurological: Negative for weakness, numbness and headaches.    Allergies  Apple; Banana; and Other  Home Medications   Prior to Admission medications   Medication Sig Start Date End Date Taking? Authorizing Provider  hydrOXYzine  (ATARAX/VISTARIL) 25 MG tablet Take 1 tablet (25 mg total) by mouth every 6 (six) hours as needed for itching. 12/30/13   Mercedes Strupp Camprubi-Soms, PA-C  permethrin (ELIMITE) 5 % cream Apply a generous amount of cream from head to feet, leave on for 8 to 14 hours, then wash off with soap/water 12/30/13   [place]ita [place] Camprubi-Soms, PA-C  triamcinolone cream (KENALOG) 0.1 % Apply 1 application topically 3 (three) times daily as needed. For itching 12/30/13   Mercedes Strupp Camprubi-Soms, PA-C   BP 120/73  Pulse 70  Temp(Src) 98.2 F (36.8 C) (Oral)  Resp 16  SpO2 99%  LMP 11/07/2013 Physical Exam  Nursing note and vitals reviewed. Constitutional: She appears well-developed and well-nourished. No distress.  HENT:  Head: Normocephalic and atraumatic.  Mouth/Throat: Oropharynx is clear and moist. No oropharyngeal exudate.  Eyes: Conjunctivae are normal.  Neck: Neck supple. No thyromegaly present.  Cardiovascular: Normal rate, regular rhythm and intact distal pulses.   Pulmonary/Chest: Effort normal and breath sounds normal. No respiratory distress. She has no wheezes. She has no rales. She exhibits no tenderness.  Abdominal: Soft. Normal appearance and bowel sounds are normal. She exhibits no distension and no mass. There is no hepatosplenomegaly. There is generalized tenderness. There is no rigidity, no rebound, no guarding, no CVA tenderness, no tenderness at McBurney's point and negative Murphy's sign.  Musculoskeletal: She exhibits no tenderness.  Lymphadenopathy:    She has no cervical adenopathy.  Neurological: She is alert.  Skin: Skin is  warm and dry. No rash noted. She is not diaphoretic.  Psychiatric: She has a normal mood and affect.    ED Course  Procedures (including critical care time) Labs Review Labs Reviewed  CBC WITH DIFFERENTIAL - Abnormal; Notable for the following:    Hemoglobin 10.8 (*)    HCT 33.3 (*)    MCV 76.9 (*)    MCH 24.9 (*)    All other  components within normal limits  COMPREHENSIVE METABOLIC PANEL  URINALYSIS, ROUTINE W REFLEX MICROSCOPIC  POC URINE PREG, ED    Imaging Review No results found.   EKG Interpretation None      MDM   Final diagnoses:  Generalized abdominal pain  Amenorrhea   Pt is 23 yo female with report of abd pain, and 2 missed periods, but is pain free now.  Pt reports reassurance regarding ED negative pregnancy test.  Pt is well appearing and no acute distress. Protocol labs without abnormality. On repeat exam, patient does not have signs of peritonitis or surgical abdomen. No indication of appendicitis, bowel obstruction, bowel perforation, cholecystitis, diverticulitis, PID or ectopic pregnancy.  Discharge instructions include symptomatic management, resources to establish care with a PCP and OB/GYN for follow-up. Patient expresses understanding and agrees with plan.   I have also discussed reasons to return immediately to the ER.  Filed Vitals:   01/22/14 1912 01/22/14 1945 01/22/14 2015 01/22/14 2047  BP: 120/73 122/68 133/65 113/76  Pulse: 70 68 83 94  Temp:      TempSrc:      Resp:      SpO2: 99% 100% 100% 99%     Harle Battiest, NP 01/26/14 1018

## 2014-01-26 NOTE — ED Provider Notes (Signed)
Medical screening examination/treatment/procedure(s) were conducted as a shared visit with non-physician practitioner(s) and myself.  I personally evaluated the patient during the encounter.   Pt c/o intermittent mid abd pain. Now resolved. Also wants pre test. u preg neg. abd soft nt. Pain resolved.  Suzi Roots, MD 01/26/14 2102

## 2015-03-21 ENCOUNTER — Emergency Department (HOSPITAL_COMMUNITY)
Admission: EM | Admit: 2015-03-21 | Discharge: 2015-03-21 | Disposition: A | Discharge status: Home / Self Care | Payer: Self-pay | Attending: Emergency Medicine | Admitting: Emergency Medicine

## 2015-03-21 ENCOUNTER — Encounter (HOSPITAL_COMMUNITY): Payer: Self-pay | Admitting: Emergency Medicine

## 2015-03-21 DIAGNOSIS — E669 Obesity, unspecified: Secondary | ICD-10-CM | POA: Insufficient documentation

## 2015-03-21 DIAGNOSIS — A599 Trichomoniasis, unspecified: Secondary | ICD-10-CM

## 2015-03-21 LAB — URINALYSIS, ROUTINE W REFLEX MICROSCOPIC
Bilirubin Urine: NEGATIVE
Glucose, UA: NEGATIVE mg/dL
Hgb urine dipstick: NEGATIVE
Ketones, ur: NEGATIVE mg/dL
NITRITE: NEGATIVE
Protein, ur: NEGATIVE mg/dL
SPECIFIC GRAVITY, URINE: 1.026 (ref 1.005–1.030)
pH: 6.5 (ref 5.0–8.0)

## 2015-03-21 LAB — COMPREHENSIVE METABOLIC PANEL
ALK PHOS: 71 U/L (ref 38–126)
ALT: 15 U/L (ref 14–54)
ANION GAP: 7 (ref 5–15)
AST: 24 U/L (ref 15–41)
Albumin: 3.9 g/dL (ref 3.5–5.0)
BUN: 11 mg/dL (ref 6–20)
CALCIUM: 9 mg/dL (ref 8.9–10.3)
CO2: 25 mmol/L (ref 22–32)
CREATININE: 0.83 mg/dL (ref 0.44–1.00)
Chloride: 107 mmol/L (ref 101–111)
GFR calc Af Amer: >60 mL/min (ref >60)
GFR calc non Af Amer: >60 mL/min (ref >60)
GLUCOSE: 78 mg/dL (ref 65–99)
Potassium: 3.7 mmol/L (ref 3.5–5.1)
Sodium: 139 mmol/L (ref 135–145)
Total Bilirubin: 0.6 mg/dL (ref 0.3–1.2)
Total Protein: 7.4 g/dL (ref 6.5–8.1)

## 2015-03-21 LAB — CBC
HCT: 33.4 % — ABNORMAL LOW (ref 36.0–46.0)
Hemoglobin: 11 g/dL — ABNORMAL LOW (ref 12.0–15.0)
MCH: 27 pg (ref 26.0–34.0)
MCHC: 32.9 g/dL (ref 30.0–36.0)
MCV: 82.1 fL (ref 78.0–100.0)
PLATELETS: 251 10*3/uL (ref 150–400)
RBC: 4.07 MIL/uL (ref 3.87–5.11)
RDW: 13.9 % (ref 11.5–15.5)
WBC: 6.5 10*3/uL (ref 4.0–10.5)

## 2015-03-21 LAB — URINE MICROSCOPIC-ADD ON

## 2015-03-21 LAB — WET PREP, GENITAL
Sperm: NONE SEEN
Yeast Wet Prep HPF POC: NONE SEEN

## 2015-03-21 LAB — I-STAT BETA HCG BLOOD, ED (MC, WL, AP ONLY)

## 2015-03-21 LAB — LIPASE, BLOOD: Lipase: 21 U/L (ref 11–51)

## 2015-03-21 MED ORDER — METRONIDAZOLE 500 MG PO TABS: 500.0000 mg | ORAL_TABLET | Freq: Two times a day (BID) | ORAL | Status: DC

## 2015-03-21 NOTE — Discharge Instructions (Signed)
1. Medications: Flagyl - Please take all of your antibiotics until finished! Do not drink alcohol on this medication!  2. Treatment: rest, drink plenty of fluids, use a condom with every sexual encounter 3. Follow Up: Please followup with your primary doctor in 3 days for discussion of your diagnoses and further evaluation after today's visit; if you do not have a primary care doctor use the resource guide provided to find one; Please return to the ER for worsening symptoms, high fevers or persistent vomiting.  You have been tested for HIV, syphilis, chlamydia and gonorrhea. These results will be available in approximately 3 days. You will be notified if they are positive.   It is very important to practice safe sex and use condoms when sexually active. If your results are positive you need to notify all sexual partners so they can be treated as well. The website [email or web address].[email or web address]/ can be used to send anonymous text messages or emails to alert sexual contacts.   Follow up with your doctor, or OBGYN in regards to today's visit.    SEEK IMMEDIATE MEDICAL CARE IF:  You develop an oral temperature above 102 F (38.9 C), not controlled by medications or lasting more than 2 days.  You develop an increase in pain.  You develop any type of abnormal discharge.  You develop vaginal bleeding and it is not time for your period.  You develop painful intercourse.    Emergency Department Resource Guide 1) Find a Doctor and Pay Out of Pocket Although you won't have to find out who is covered by your insurance plan, it is a good idea to ask around and get recommendations. You will then need to call the office and see if the doctor you have chosen will accept you as a new patient and what types of options they offer for patients who are self-pay. Some doctors offer discounts or will set up payment plans for their patients who do not have insurance, but you will need to ask so you aren't surprised when  you get to your appointment.  2) Contact Your Local Health Department Not all health departments have doctors that can see patients for sick visits, but many do, so it is worth a call to see if yours does. If you don't know where your local health department is, you can check in your phone book. The CDC also has a tool to help you locate your state's health department, and many state websites also have listings of all of their local health departments.  3) Find a Walk-in Clinic If your illness is not likely to be very severe or complicated, you may want to try a walk in clinic. These are popping up all over the country in pharmacies, drugstores, and shopping centers. They're usually staffed by nurse practitioners or physician assistants that have been trained to treat common illnesses and complaints. They're usually fairly quick and inexpensive. However, if you have serious medical issues or chronic medical problems, these are probably not your best option.  No Primary Care Doctor: - Call Health Connect at  [phone] - they can help you locate a primary care doctor that  accepts your insurance, provides certain services, etc. - Physician Referral Service- [phone]  Chronic Pain Problems: Organization         Address  Phone   Notes  [facility]  [phone] Patients need to be referred by their primary care doctor.   Medication Assistance: Organization  Address  Phone   Notes  [facility] Med[facility] [address] [place], [place] [address] [phone] --Must be a resident of [facility] -- Must have NO insurance coverage whatsoever (no Medicaid/ Medicare, etc.) -- The pt. MUST have a primary care doctor that directs their care regularly and follows them in the community   [facility]  [phone]   [employer]  [phone]    Agencies that provide inexpensive medical care: Organization         Address  Phone    Notes  [facility]  [phone]   [facility]    [phone]   [facility] [address] [place], [place] [address] [phone]   [facility] [address] [place] [place] [phone]   Planned Parenthood    [phone]   [facility]    [phone]   [facility]  [address], [place] Phone:  [phone], Fax:  [phone] Hours of Operation:  9 am - 6 pm, M-F.  Also accepts Medicaid/Medicare and self-pay.  [facility]  [address], [place] Phone: [phone], Fax: [phone]. Hours of Operation:  8:30 am - 5:30 pm, M-F.  Also accepts Medicaid and self-pay.  [facility] [place] [address], [place] Phone: [phone]   [facility] [address] Natasha Bence [place], [place] [phone], Ext. [phone] Mondays & Thursdays: 7-9 AM.  First 15 patients are seen on a first come, first serve basis.    Medicaid-accepting [facility] Providers:  Organization         Address  Phone   Notes  [facility] [address], [place] [phone] Also accepts self-pay patients.  [facility] [address] Laurell Josephs [place], [place]  [phone]   [facility] [address], [place] [phone]   [facility] [address], [place] [phone]   Renaye Rakers [address], [place]   [phone] Only accepts [place] patients after they have their name applied to their card.   Self-Pay (no insurance) in [facility]:  Organization         Address  Phone   Notes  Sickle Cell Patients, [facility] [address] [place], [place] [phone]   [facility] [address] [place], [place] [phone]   [facility]  [facility] [place]  [address], [place] [phone]   [facility]/Dr. Osei-Bonsu  [address] [place] or [address], [place] [phone] Phone number for both [place] and [place] locations is the same.  [facility] [address] [place] [phone]   [facility] [address] [place] or [address] [phone]   [facility] [address] [place] [phone], phone; [phone], fax Sees patients 1st and 3rd Saturday of every month.  Must not qualify for public or private insurance (i.e. Medicaid, Medicare,  Health Choice, Veterans' Benefits)  Household income should be no more than 200% of the poverty level The clinic cannot treat you if you are pregnant or think you  are pregnant  Sexually transmitted diseases are not treated at the clinic.    Dental Care: Organization         Address  Phone  Notes  [facility] [address], [place] [phone] Accepts children up to age 23 who are enrolled in [place]icaid or [place] Health Choice; pregnant women with a Medicaid card; and children who have applied for Medicaid or [place] Health Choice, but were declined, whose parents can pay a reduced fee at time of service.  [facility] [place]  [address], [place] [phone]) [phone] Accepts children up to age 23 who are enrolled in [place] or [place] Health Choice; pregnant women with a Medicaid card; and children who have applied for Medicaid or [place] Health Choice, but were declined, whose parents can pay a reduced fee at time of service.  [facility]  [address], [place] [phone]) [phone] Patients are seen by appointment only. Walk-ins are not accepted. [facility] will see patients 23 years of age  and older. Monday - Tuesday (8am-5pm) Most Wednesdays (8:30-5pm) $30 per visit, cash only  [facility]  [address], [facility] [phone]) [phone] Patients are seen by appointment only. Walk-ins are not accepted. [facility] will see patients 23 years of age and older. One Wednesday Evening (Monthly: Volunteer Based).  $30 per visit, cash only  [employer] [facility] [facility]  [phone] for adults; Children under age 554, call Graduate Pediatric Dentistry at [phone]. Children aged 234-14, please call [phone] to request a pediatric application.  Dental services are provided in all areas of dental care including fillings, crowns and bridges, complete and partial dentures, implants, gum treatment, root canals, and extractions. Preventive care is also provided. Treatment is provided to both adults and children. Patients are selected via a lottery and there is often a waiting list.   [facility] [address], [place]  [phone]) [phone] [email or web address]   [facility] [address], [place], [place] [phone], Ext. [phone] Second and Fourth Thursday of each month, opens at 6:30 AM; Clinic ends at 9 AM.  Patients are seen on a first-come first-served basis, and a limited number are seen during each clinic.   [facility]  [address], [place], [place] [phone]) [phone]   Eligibility Requirements You must have lived in [place], [place], or [place] for at least the last three months.   You cannot be eligible for state or federal sponsored [place] Cityhealthcare insurance, including CIGNAVeterans Administration, [place]icaid, or [employer]Medicare.   You generally cannot be eligible for healthcare insurance through your employer.    How to apply: Eligibility screenings are held every Tuesday and Wednesday afternoon from 1:00 pm until 4:00 pm. You do not need an appointment for the interview!  [facility] [address], [place], [place] [phone]   [facility]  [phone]   [facility]  [phone]   [facility]  [phone]    Behavioral Health Resources in the Community: Intensive Outpatient Programs Organization         Address  Phone  Notes  [facility] [address], [place], [place] [phone]   [facility] Outpatient [address], [place], [place] [phone]   [facility] [address], [place], [place]  [phone]   [facility] [address],  [place], [place] [phone] or [phone]   Substance Abuse Resources Organization         Address  Phone  Notes  [facility]  [phone]   [facility]  [phone]   [facility]  [phone]   [facility]  [phone]   Residential & Outpatient Substance Abuse Program  [phone]   Psychological Services Organization         Address  Phone  Notes  [facility]  [phone]- [phone]   [facility]  [phone]- [phone]   [facility] [address], [place] [phone] or [phone]    Mobile Crisis Teams Organization         Address  Phone  Notes  [facility], Mobile Crisis Care Unit  [phone]   [facility]  [address] [place], [place] [phone]   Doristine LocksSharon DeEsch [address] [place] [phone]    Self-Help/Support Groups Organization         Address  Phone             Notes  [facility] [place] - variety of support groups  [phone]- [phone] Call for more information  Narcotics Anonymous (NA), [facility] [address], [place]  2 meetings at this location   Statisticianesidential Treatment Programs Organization         Address  Phone  Notes  [facility] [address],    [place]  [phone]   [facility]  [address], [place] [phone], [place], [place] [phone]   [facility] [address], [place] [phone] Admissions: 8am-3pm M-F  [facility] [address]    [place], [place] [phone]   [facility] [address], [place], [place] [phone]   [facility] [address]  [place], [place] [phone]   [facility] - Intensive Outpatient [address], [place], [place] [phone]   [facility] ([facility] [address]  [place], [place] [phone] or [phone]   [facility] (RT[facility]) [address] [place], [place] [phone] Accepts Medicaid  [facility] [address]  [place] [phone] Substance Abuse/Addiction Treatment   [facility] Resources Organization         Address  Phone  Notes  [facility]  [phone]   Angie FavaJulie Brannon, PhD [address], [address] [place], [place]   [phone]) [phone] or [phone]   [facility]   [address] [place], [place] [phone](336) [phone]   [facility] [address], [place], [place] [phone]) [phone] Insurance/Medicaid/sponsorship through [facility]  [facility] Families [address]                                    [place], [place] [phone]) [phone] Therapy/tele-psych/case  [facility] [address].   [place], [place] [phone]) [phone]    Dr. Lolly MustacheArfeen  [phone](336) [phone]   [facility]  [employer] [facility] 1) [address], [place] 2) [address], [place] 3)  [address], [place] [phone]) [phone]) [phone]  [phone](336) [phone]   [facility] Child Abuse Hotline [phone](336)  [phone] or [phone] (After Hours)       Trichomoniasis Trichomoniasis is an infection caused by an organism called Trichomonas. The infection can affect both women and men. In women, the  outer female genitalia and the vagina are affected. In men, the penis is mainly affected, but the prostate and other reproductive organs can also be involved. Trichomoniasis is a sexually transmitted infection (STI) and is most often passed to another person through sexual contact.  RISK FACTORS  Having unprotected sexual intercourse.  Having sexual intercourse with an infected partner. SIGNS AND SYMPTOMS  Symptoms of trichomoniasis in women include:  Abnormal gray-green frothy vaginal discharge.  Itching and irritation of the vagina.  Itching and irritation of the area outside the vagina. Symptoms of trichomoniasis in men include:   Penile discharge with or without pain.  Pain during urination. This results from inflammation of the urethra. DIAGNOSIS  Trichomoniasis may be found during a Pap test or physical exam. Your health care provider may use one of the following methods to help diagnose this infection:  Testing the pH of the vagina with a test tape.  Using a vaginal swab test that checks for the Trichomonas organism. A test is available that provides results within a few minutes.  Examining a urine sample.  Testing vaginal secretions. Your health care provider may test you for other STIs, including HIV. TREATMENT   You may be given medicine to fight the infection. Women should inform their health care provider if they could be or are pregnant. Some medicines used to treat the infection should not be taken during pregnancy.  Your health care provider may recommend over-the-counter medicines or creams to decrease itching or irritation.  Your sexual partner will need to be treated if infected.  Your health care provider may test you for infection again 3 months after treatment. HOME CARE INSTRUCTIONS   Take medicines only as directed by your health care provider.  Take over-the-counter medicine for itching or irritation as directed by your health care provider.  Do  not have sexual intercourse while you have the infection.  Women should not douche or wear tampons while they have the infection.  Discuss your infection with your partner. Your partner may have gotten the infection from you, or you may have gotten it from your partner.  Have your sex partner get examined and treated if necessary.  Practice safe, informed, and protected sex.  See your health care provider for other STI testing. SEEK MEDICAL CARE IF:   You still have symptoms after you finish your medicine.  You develop abdominal pain.  You have pain when you urinate.  You have bleeding after sexual intercourse.  You develop a rash.  Your medicine makes you sick or makes you throw up (vomit). MAKE SURE YOU:  Understand these instructions.  Will watch your condition.  Will get help right away if you are not doing well or get worse.   This information is not intended to replace advice given to you by your health care provider. Make sure you discuss any questions you have with your health care provider.   Document Released: 10/13/2000 Document Revised: 05/10/2014 Document Reviewed: 01/29/2013 Elsevier Interactive Patient Education [employer]

## 2015-03-21 NOTE — ED Notes (Signed)
Pt from home with c/o intermittent lower mid abdominal cramping x 2 weeks.  Pt reports hx of irregular periods and the cramping feels similar.  Pt denies vaginal bleeding.  Pt states she feels like she has a yeast infection and increased urinary frequency.  Denies burning with urination.  Pt in NAD, A&O.

## 2015-03-21 NOTE — ED Provider Notes (Signed)
CSN: [id number]     Arrival date & time 03/21/15  [id number] History   First MD Initiated Contact with Patient 03/21/15 [phone]     Chief Complaint  Patient presents with  . Abdominal Pain     (Consider location/radiation/quality/duration/timing/severity/associated sxs/prior Treatment) Patient is a 24 y.o. female presenting with abdominal pain. The history is provided by the patient and medical records. No language interpreter was used.  Abdominal Pain Associated symptoms: vaginal discharge   Associated symptoms: no constipation, no cough, no diarrhea, no dysuria, no nausea, no shortness of breath, no sore throat, no vaginal bleeding and no vomiting    Andrea Gill is a 24 y.o. female  with no significant PMH who presents to the Emergency Department complaining of intermittent bilateral lower abdominal pain x approx. 2-3 weeks. No radiation of pain. Associated symptoms include increased urgency and frequency, vaginal itching, white vaginal discharge. Patient denies dysuria and vaginal bleeding. Patient states she used Monostat cream for one day and noticed some relief. No other alleviating factors noted. Denies aggravating factors.    Past Medical History  Diagnosis Date  . Obesity    History reviewed. No pertinent past surgical history. History reviewed. No pertinent family history. Social History  Substance Use Topics  . Smoking status: Never Smoker   . Smokeless tobacco: None  . Alcohol Use: Yes   OB History    No data available     Review of Systems  Constitutional: Negative.   HENT: Negative for congestion, rhinorrhea and sore throat.   Eyes: Negative for visual disturbance.  Respiratory: Negative for cough, shortness of breath and wheezing.   Cardiovascular: Negative.   Gastrointestinal: Positive for abdominal pain. Negative for nausea, vomiting, diarrhea and constipation.  Genitourinary: Positive for urgency, frequency and vaginal discharge. Negative for dysuria and  vaginal bleeding.  Musculoskeletal: Negative for myalgias, back pain, arthralgias and neck pain.  Skin: Negative for rash.  Neurological: Negative for dizziness, weakness and headaches.      Allergies  Apple; Banana; and Other  Home Medications   Prior to Admission medications   Medication Sig Start Date End Date Taking? Authorizing Provider  metroNIDAZOLE (FLAGYL) 500 MG tablet Take 1 tablet (500 mg total) by mouth 2 (two) times daily. X 7 days 03/21/15   [facility]ime Pilcher Andrea Kronberg, PA-C   BP 122/62 mmHg  Pulse 86  Temp(Src) 97.5 F (36.4 C) (Oral)  Resp 18  SpO2 99%  LMP 02/12/2015 (Approximate) Physical Exam  Constitutional: She is oriented to person, place, and time. She appears well-developed and well-nourished.  Alert and in no acute distress  HENT:  Head: Normocephalic and atraumatic.  Cardiovascular: Normal rate, regular rhythm, normal heart sounds and intact distal pulses.  Exam reveals no gallop and no friction rub.   No murmur heard. Pulmonary/Chest: Effort normal and breath sounds normal. No respiratory distress. She has no wheezes. She has no rales. She exhibits no tenderness.  Abdominal: She exhibits no mass. There is no rebound and no guarding.  Abdomen soft, non-distended Mild discomfort with palpation of lower abdomen (RLQ, LLQ, suprapubic) Bowel sounds positive in all four quadrants  Genitourinary: There is no rash, tenderness or lesion on the right labia. There is no rash, tenderness or lesion on the left labia. Cervix exhibits no motion tenderness. Right adnexum displays no mass, no tenderness and no fullness. Left adnexum displays no mass, no tenderness and no fullness. No erythema, tenderness or bleeding in the vagina. Vaginal discharge (Thick, white) found.  Musculoskeletal: She exhibits  no edema.  Neurological: She is alert and oriented to person, place, and time.  Skin: Skin is warm and dry. No rash noted.  Psychiatric: She has a normal mood and affect. Her  behavior is normal. Judgment and thought content normal.  Nursing note and vitals reviewed.   ED Course  Procedures (including critical care time) Labs Review Labs Reviewed  WET PREP, GENITAL - Abnormal; Notable for the following:    Trich, Wet Prep PRESENT (*)    Clue Cells Wet Prep HPF POC PRESENT (*)    WBC, Wet Prep HPF POC MANY (*)    All other components within normal limits  CBC - Abnormal; Notable for the following:    Hemoglobin 11.0 (*)    HCT 33.4 (*)    All other components within normal limits  URINALYSIS, ROUTINE W REFLEX MICROSCOPIC (NOT AT [facility]) - Abnormal; Notable for the following:    APPearance HAZY (*)    Leukocytes, UA MODERATE (*)    All other components within normal limits  URINE MICROSCOPIC-ADD ON - Abnormal; Notable for the following:    Squamous Epithelial / LPF 6-30 (*)    Bacteria, UA FEW (*)    All other components within normal limits  LIPASE, BLOOD  COMPREHENSIVE METABOLIC PANEL  HIV ANTIBODY (ROUTINE TESTING)  RPR  I-STAT BETA HCG BLOOD, ED ([facility], [facility], [facility] ONLY)  GC/CHLAMYDIA PROBE AMP ([place]) NOT AT [facility]    Imaging Review No results found. I have personally reviewed and evaluated these images and lab results as part of my medical decision-making.   EKG Interpretation None      MDM   Final diagnoses:  Trichimoniasis   Andrea Gill presents with lower abdominal pain and vaginal discharge.   Labs: Lipase, CMP wdl; CBC reassuring; upreg negative; UA mod leuks, few bacteria, and squamous present Thick, white discharge on pelvic exam.  Wet prep + for clue cells and trich  G&C, HIV, and RPR sent out- patient informed that she will be called if these tests come back positive.   .Patient discharged to home with Flagyl - informed not to drink on medication, and educated about importance of informing partners to be evaluated. Answered all patient questions, and she expresses understanding and agreement with plan.     Andrea Gill Andrea Manka, PA-C 03/21/15 1250  Azalia Bilis, MD 03/21/15 1300

## 2015-03-22 LAB — HIV ANTIBODY (ROUTINE TESTING W REFLEX): HIV SCREEN 4TH GENERATION: NONREACTIVE

## 2015-03-23 LAB — RPR: RPR Ser Ql: NONREACTIVE

## 2015-03-24 LAB — GC/CHLAMYDIA PROBE AMP (~~LOC~~) NOT AT ARMC
Chlamydia: NEGATIVE
NEISSERIA GONORRHEA: NEGATIVE

## 2016-03-19 ENCOUNTER — Encounter (HOSPITAL_COMMUNITY): Payer: Self-pay | Admitting: Emergency Medicine

## 2016-03-19 ENCOUNTER — Emergency Department (HOSPITAL_COMMUNITY)
Admission: EM | Admit: 2016-03-19 | Discharge: 2016-03-19 | Disposition: A | Discharge status: Home / Self Care | Payer: Self-pay | Attending: Emergency Medicine | Admitting: Emergency Medicine

## 2016-03-19 DIAGNOSIS — N6320 Unspecified lump in the left breast, unspecified quadrant: Secondary | ICD-10-CM | POA: Insufficient documentation

## 2016-03-19 DIAGNOSIS — N63 Unspecified lump in unspecified breast: Secondary | ICD-10-CM

## 2016-03-19 DIAGNOSIS — J069 Acute upper respiratory infection, unspecified: Secondary | ICD-10-CM | POA: Insufficient documentation

## 2016-03-19 LAB — RAPID STREP SCREEN (MED CTR MEBANE ONLY): STREPTOCOCCUS, GROUP A SCREEN (DIRECT): NEGATIVE

## 2016-03-19 MED ORDER — IBUPROFEN 200 MG PO TABS
400.0000 mg | ORAL_TABLET | Freq: Once | ORAL | Status: AC
  Administered 2016-03-19: 400 mg via ORAL
  Filled 2016-03-19: qty 2

## 2016-03-19 NOTE — ED Triage Notes (Signed)
Per pt, states cold symptoms which started 2 days ago-congestion, sore throat, body aches

## 2016-03-19 NOTE — Discharge Instructions (Signed)
Do not hesitate to return to the emergency room for any new, worsening or concerning symptoms. ° °Please obtain primary care using resource guide below. Let them know that you were seen in the emergency room and that they will need to obtain records for further outpatient management. ° ° °

## 2016-03-19 NOTE — Progress Notes (Signed)
Entered in d/c instructions Please use the resources provided to you in emergency room by case manager to assist you're your choice of doctor for follow up  These [place] uninsured resources provide possible primary care providers, resources for discounted medications, housing, dental resources, affordable care act information, plus other resources for [place]   A referral for you has been sent to [employer] [facility] network if you have not received a call in 3 days you may contact them Call Andrea RanKaren Gill at [phone] Tuesday-Friday [email or web address] If immediate follow up services is needed

## 2016-03-19 NOTE — Progress Notes (Signed)
CM spoke with pt who [place]cp.  CM discussed and provided written information to assist pt with determining choice for uninsured accepting pcps, discussed the importance of pcp vs EDP services for f/u care, www.needymeds.org, www.goodrx.com, discounted pharmacies and other [place] resources such as [facility] , [facility], affordable care act, financial assistance, uninsured dental services, [place] med assist, DSS and  health department  Reviewed resources for [place] uninsured accepting pcps like [facility], family medicine at [address], [facility], [facility], local urgent care centers, [facility], [facility] family practice, general medical clinics, [facility] services of the [place], [facility] urgent care plus others, medication resources, [facility] out patient pharmacies and housing Pt voiced understanding and appreciation of resources provided   Provided [facility] contact information Pt agreed to a referral Cm completed referral Pt to be contact by [facility] clinical liaison  CM able to answer questions for pt related affordable care act, [facility] insurance plans Pt confirms she works at [employer] part time not full time and not able to get coverage yet Pt has a sister working at [employer] who maybe able to assist her

## 2016-03-19 NOTE — ED Provider Notes (Signed)
[facility]-EMERGENCY DEPT Provider Note   CSN: [id number] Arrival date & time: 03/19/16  1418     History    Chief Complaint Chief Complaint  Patient presents with  . URI      HPI   Blood pressure 114/86, pulse 66, temperature 98.3 F (36.8 C), temperature source Oral, resp. rate 16, last menstrual period 03/07/2016, SpO2 100 %.  Andrea Gill is a 25 y.o. female complaining of tactile fevers, chills, upper respiratory symptoms including sore throat and rhinorrhea with headache and loss of voice yesterday now resolved, patient states overall she feels much better today. She denies sick contacts, chest pain, shortness of breath, nausea, vomiting, cervicalgia, numbness, weakness, abdominal pain, change in bowel or bladder habits.she notes that she had a breast mass to the left breast which is now resolved several months ago, she had a another breast mass on the inferior part of the breast which was dark and quite painful, this is resolving over the course of a week. She denies acute unintentional weight loss but states that she has lost 100 pounds in the last year, night sweats, easy bruising or bleeding, family history of breast cancer.   Past Medical History:  Diagnosis Date  . Obesity     There are no active problems to display for this patient.   History reviewed. No pertinent surgical history.  OB History    No data available       Home Medications    Prior to Admission medications   Medication Sig Start Date End Date Taking? Authorizing Provider  metroNIDAZOLE (FLAGYL) 500 MG tablet Take 1 tablet (500 mg total) by mouth 2 (two) times daily. X 7 days 03/21/15   Chase PicketJaime Pilcher Ward, PA-C    Family History No family history on file.  Social History Social History  Substance Use Topics  . Smoking status: Never Smoker  . Smokeless tobacco: Not on file  . Alcohol use Yes     Allergies   Apple; Banana; and Other   Review of Systems Review of Systems  10  systems reviewed and found to be negative, except as noted in the HPI.  Physical Exam Updated Vital Signs BP 114/86 (BP Location: Left Arm)   Pulse 66   Temp 98.3 F (36.8 C) (Oral)   Resp 16   LMP 03/07/2016   SpO2 100%   Physical Exam  Constitutional: She is oriented to person, place, and time. She appears well-developed and well-nourished. No distress.  HENT:  Head: Normocephalic and atraumatic.  Right Ear: External ear normal.  Left Ear: External ear normal.  Mouth/Throat: Oropharynx is clear and moist. No oropharyngeal exudate.  No drooling or stridor. Posterior pharynx mildly erythematous no significant tonsillar hypertrophy. No exudate. Soft palate rises symmetrically. No TTP or induration under tongue.   No tenderness to palpation of frontal or bilateral maxillary sinuses.  Mild mucosal edema in the nares with scant rhinorrhea.  Bilateral tympanic membranes with normal architecture and good light reflex.    Eyes: Conjunctivae and EOM are normal. Pupils are equal, round, and reactive to light.  Neck: Normal range of motion. Neck supple.  FROM to C-spine. Pt can touch chin to chest without discomfort. No TTP of midline cervical spine.   Cardiovascular: Normal rate, regular rhythm and intact distal pulses.   Pulmonary/Chest: Effort normal and breath sounds normal. No stridor. No respiratory distress. She has no wheezes. She has no rales. She exhibits no tenderness.  Abdominal: Soft. She exhibits no distension and  no mass. There is no tenderness. There is no rebound and no guarding. No hernia.  Genitourinary:  Genitourinary Comments: Breast exam with no nipple discharge, peau d'orange, on the inferior aspect of the left breast there is an area that is hyperpigmented proximally 1 x 2 cm with mild underlying tenderness, no gross mass appreciated. No axillary lymphadenopathy  Musculoskeletal: Normal range of motion.  Neurological: She is alert and oriented to person, place, and  time.  Skin: Capillary refill takes less than 2 seconds. She is not diaphoretic.  Psychiatric: She has a normal mood and affect.  Nursing note and vitals reviewed.    ED Treatments / Results  Labs (all labs ordered are listed, but only abnormal results are displayed) Labs Reviewed  RAPID STREP SCREEN (NOT AT [facility])  CULTURE, GROUP A STREP [facility])    EKG  EKG Interpretation None       Radiology No results found.  Procedures Procedures (including critical care time)  Medications Ordered in ED Medications  ibuprofen (ADVIL,MOTRIN) tablet 400 mg (400 mg Oral Given 03/19/16 1452)     Initial Impression / Assessment and Plan / ED Course  I have reviewed the triage vital signs and the nursing notes.  Pertinent labs & imaging results that were available during my care of the patient were reviewed by me and considered in my medical decision making (see chart for details).  Clinical Course    Vitals:   03/19/16 1426 03/19/16 1453  BP: 133/83 114/86  Pulse: 71 66  Resp: 18 16  Temp: 98.5 F (36.9 C) 98.3 F (36.8 C)  TempSrc: Oral Oral  SpO2: 100% 100%    Medications  ibuprofen (ADVIL,MOTRIN) tablet 400 mg (400 mg Oral Given 03/19/16 1452)    Andrea Gill is 25 y.o. female presenting with Sore throat, tactile fever chills, rhinorrhea and generalized fatigue and resolved laryngitis. All symptoms started with normal last day and have been improving rapidly. Triage initiated rapid strep is negative, this is likely a viral URI. Of note, patient does have a hyperpigmented breast mass consistent with a abscess. She is without primary care, referral was given to women's, case management is consulted so the patient can establish primary care. Work note provided.  Evaluation does not show pathology that would require ongoing emergent intervention or inpatient treatment. Pt is hemodynamically stable and mentating appropriately. Discussed findings and plan with  patient/guardian, who agrees with care plan. All questions answered. Return precautions discussed and outpatient follow up given.   Final Clinical Impressions(s) / ED Diagnoses   Final diagnoses:  Viral upper respiratory tract infection  Breast mass in female    New Prescriptions Discharge Medication List as of 03/19/2016  3:26 PM       Wynetta Emeryicole Zula Hovsepian, PA-C 03/19/16 1548    Gerhard Munchobert Lockwood, MD 03/19/16 1752

## 2016-03-22 LAB — CULTURE, GROUP A STREP (THRC)

## 2016-06-08 ENCOUNTER — Emergency Department (HOSPITAL_COMMUNITY): Payer: Self-pay

## 2016-06-08 ENCOUNTER — Encounter (HOSPITAL_COMMUNITY): Payer: Self-pay | Admitting: *Deleted

## 2016-06-08 ENCOUNTER — Emergency Department (HOSPITAL_COMMUNITY)
Admission: EM | Admit: 2016-06-08 | Discharge: 2016-06-08 | Disposition: A | Discharge status: Home / Self Care | Payer: Self-pay | Attending: Emergency Medicine | Admitting: Emergency Medicine

## 2016-06-08 DIAGNOSIS — S99921A Unspecified injury of right foot, initial encounter: Secondary | ICD-10-CM

## 2016-06-08 DIAGNOSIS — Y929 Unspecified place or not applicable: Secondary | ICD-10-CM | POA: Insufficient documentation

## 2016-06-08 DIAGNOSIS — Y999 Unspecified external cause status: Secondary | ICD-10-CM | POA: Insufficient documentation

## 2016-06-08 DIAGNOSIS — S90411A Abrasion, right great toe, initial encounter: Secondary | ICD-10-CM | POA: Insufficient documentation

## 2016-06-08 DIAGNOSIS — Y939 Activity, unspecified: Secondary | ICD-10-CM | POA: Insufficient documentation

## 2016-06-08 DIAGNOSIS — W208XXA Other cause of strike by thrown, projected or falling object, initial encounter: Secondary | ICD-10-CM | POA: Insufficient documentation

## 2016-06-08 MED ORDER — NAPROXEN 500 MG PO TABS: 500.0000 mg | ORAL_TABLET | Freq: Two times a day (BID) | ORAL | 0 refills | Status: DC

## 2016-06-08 NOTE — ED Provider Notes (Signed)
[facility]-EMERG[facility]Y DEPT Provider Note   CSN: [id number] Arrival date & time: 06/08/16  2035  By signing my name below, I, Talbert Nan, attest that this documentation has been prepared under the direction and in the presence of Sharilyn Sites, PA-C. Electronically Signed: Talbert Nan, Scribe. 06/08/16. 10:08 PM.   History   Chief Complaint Chief Complaint  Patient presents with  . Foot Injury    HPI Andrea Gill is a 26 y.o. female who presents to the Emergency Department complaining of acute onset, moderate, persistent great toe pain on the right foot s/p furniture being dropped on it 5 days ago. Pt has not been able to put a shoe on it because of pain. Pt has been using crutches and has not been able to put weight on it. Pt states that she has been trying to let it rest.  Has not taken any medications for pain.  The history is provided by the patient. No language interpreter was used.    Past Medical History:  Diagnosis Date  . Obesity     There are no active problems to display for this patient.   History reviewed. No pertinent surgical history.  OB History    No data available       Home Medications    Prior to Admission medications   Medication Sig Start Date End Date Taking? Authorizing Provider  metroNIDAZOLE (FLAGYL) 500 MG tablet Take 1 tablet (500 mg total) by mouth 2 (two) times daily. X 7 days 03/21/15   Chase Picket Ward, PA-C    Family History No family history on file.  Social History Social History  Substance Use Topics  . Smoking status: Never Smoker  . Smokeless tobacco: Never Used  . Alcohol use Yes     Allergies   Apple; Banana; and Other   Review of Systems Review of Systems  Constitutional: Negative for fever.  Musculoskeletal: Positive for arthralgias and myalgias.  All other systems reviewed and are negative.    Physical Exam Updated Vital Signs BP 128/88 (BP Location: Right Arm)   Pulse 72   Temp 98 F (36.7 C) (Oral)    Resp 16   Wt 168 lb (76.2 kg)   SpO2 99%   Physical Exam  Constitutional: She is oriented to person, place, and time. She appears well-developed and well-nourished.  HENT:  Head: Normocephalic and atraumatic.  Mouth/Throat: Oropharynx is clear and moist.  Eyes: Conjunctivae and EOM are normal. Pupils are equal, round, and reactive to light.  Neck: Normal range of motion.  Cardiovascular: Normal rate, regular rhythm and normal heart sounds.   Pulmonary/Chest: Effort normal and breath sounds normal.  Abdominal: Soft. Bowel sounds are normal.  Musculoskeletal: Normal range of motion.  Small abrasion over dorsal right great toe; there is mild swelling but no bruising or bony deformity; pain with palpation along solar aspect of great toe; able to move toes normally; normal cap refill; normal sensation  Neurological: She is alert and oriented to person, place, and time.  Skin: Skin is warm and dry.  Psychiatric: She has a normal mood and affect.  Nursing note and vitals reviewed.    ED Treatments / Results   DIAGNOSTIC STUDIES: Oxygen Saturation is 99% on room air, normal by my interpretation.    COORDINATION OF CARE: 9:56 PM Discussed treatment plan with pt at bedside and pt agreed to plan, which includes keeping the extremity above the heart and give the pt pain medication.  Labs (all labs ordered are  listed, but only abnormal results are displayed) Labs Reviewed - No data to display  EKG  EKG Interpretation None       Radiology Dg Toe Great Right  Result Date: 06/08/2016 CLINICAL DATA:  Crush injury of the great toe EXAM: RIGHT GREAT TOE COMPARISON:  None. FINDINGS: There is no evidence of fracture or dislocation. There is no evidence of arthropathy or other focal bone abnormality. Soft tissues are unremarkable. IMPRESSION: No fracture or dislocation of the right great toe. Electronically Signed   By: Deatra RobinsonKevin  Herman M.D.   On: 06/08/2016 21:51    Procedures Procedures  (including critical care time)  Medications Ordered in ED Medications - No data to display   Initial Impression / Assessment and Plan / ED Course  I have reviewed the triage vital signs and the nursing notes.  Pertinent labs & imaging results that were available during my care of the patient were reviewed by me and considered in my medical decision making (see chart for details).  26 year old female here with right great toe pain after dropping a dresser on it 5 days ago. She is a small abrasion to the dorsal toe but no bony deformity. Foot is neurovascularly intact. X-ray negative for acute bony findings. Foot was wrapped and placed in postop shoe. Recommended to ice and elevate at home. NSAIDs for pain. Follow-up with PCP if not improving.  Discussed plan with patient, she acknowledged understanding and agreed with plan of care.  Return precautions given for new or worsening symptoms.  Final Clinical Impressions(s) / ED Diagnoses   Final diagnoses:  Injury of toe on right foot, initial encounter    New Prescriptions Discharge Medication List as of 06/08/2016 10:29 PM    START taking these medications   Details  naproxen (NAPROSYN) 500 MG tablet Take 1 tablet (500 mg total) by mouth 2 (two) times daily with a meal., Starting Tue 06/08/2016, Print       I personally performed the services described in this documentation, which was scribed in my presence. The recorded information has been reviewed and is accurate.   Garlon HatchetLisa M Hildur Bayer, PA-C 06/08/16 2247    Mancel BaleElliott Wentz, MD 06/09/16 [phone]

## 2016-06-08 NOTE — Discharge Instructions (Signed)
Ice and elevate your foot at home to help with pain/swelling. Take naprosyn for pain. Follow-up with your doctor as needed. Return here for new concerns.

## 2016-06-08 NOTE — ED Triage Notes (Signed)
Right great toe pain pain and swelling.  Pt dropped dresser on it 5 days ago and pain began yesterday.

## 2017-10-29 IMAGING — CR DG TOE GREAT 2+V*R*
3 series · 3 of 3 positions shown · non-contrast
Comparison: None.

CLINICAL DATA: Crush injury of the great toe

EXAM:
RIGHT GREAT TOE

[x toes ap right]
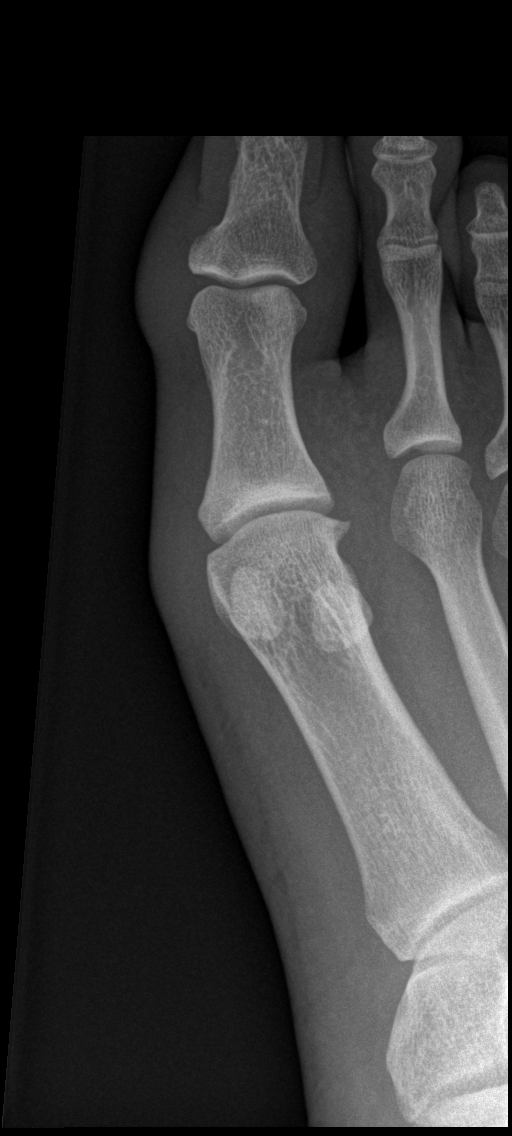

[x toes obl right]
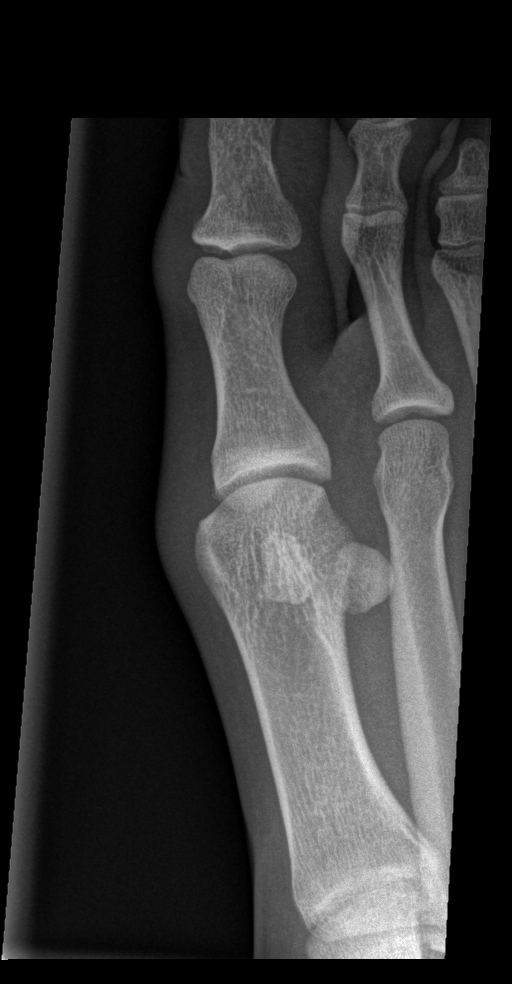

[x toes lat right]
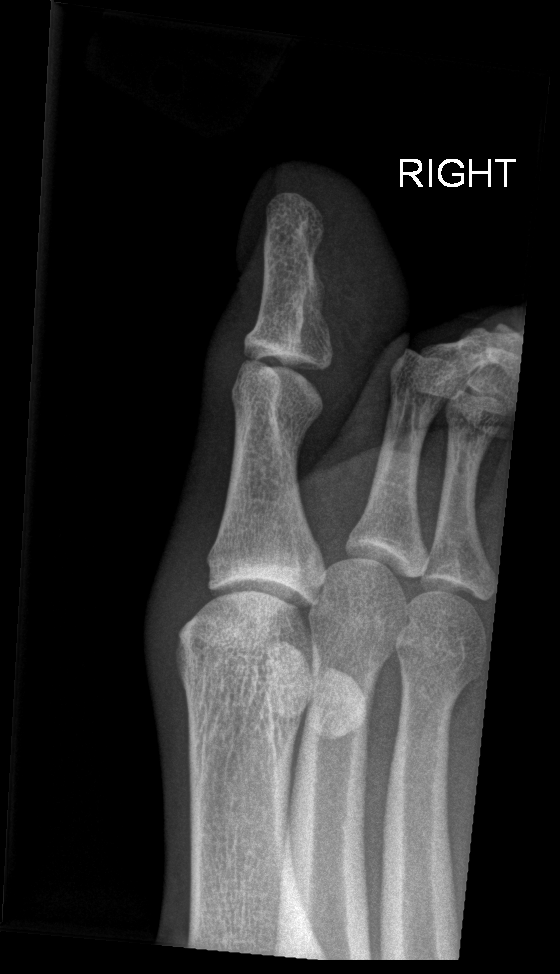

[3 of 3 positions shown; findings below may reference images not displayed]

FINDINGS: There is no evidence of fracture or dislocation. There is no
evidence of arthropathy or other focal bone abnormality. Soft
tissues are unremarkable.
IMPRESSION: No fracture or dislocation of the right great toe.

## 2018-05-08 ENCOUNTER — Encounter (HOSPITAL_COMMUNITY): Payer: Self-pay | Admitting: Emergency Medicine

## 2018-05-08 ENCOUNTER — Emergency Department (HOSPITAL_COMMUNITY)
Admission: EM | Admit: 2018-05-08 | Discharge: 2018-05-08 | Disposition: A | Discharge status: Home / Self Care | Payer: Self-pay | Attending: Emergency Medicine | Admitting: Emergency Medicine

## 2018-05-08 DIAGNOSIS — L03012 Cellulitis of left finger: Secondary | ICD-10-CM | POA: Insufficient documentation

## 2018-05-08 LAB — POC URINE PREG, ED: Preg Test, Ur: NEGATIVE

## 2018-05-08 MED ORDER — CEPHALEXIN 500 MG PO CAPS
500.0000 mg | ORAL_CAPSULE | Freq: Once | ORAL | Status: AC
  Administered 2018-05-08: 500 mg via ORAL
  Filled 2018-05-08: qty 1

## 2018-05-08 MED ORDER — CEPHALEXIN 500 MG PO CAPS: 500.0000 mg | ORAL_CAPSULE | Freq: Four times a day (QID) | ORAL | 0 refills | Status: AC

## 2018-05-08 MED ORDER — IBUPROFEN 200 MG PO TABS
600.0000 mg | ORAL_TABLET | Freq: Once | ORAL | Status: AC
  Administered 2018-05-08: 600 mg via ORAL
  Filled 2018-05-08: qty 3

## 2018-05-08 MED ORDER — SULFAMETHOXAZOLE-TRIMETHOPRIM 800-160 MG PO TABS: 1.0000 | ORAL_TABLET | Freq: Two times a day (BID) | ORAL | 0 refills | Status: AC

## 2018-05-08 MED ORDER — NAPROXEN 500 MG PO TABS: 500.0000 mg | ORAL_TABLET | Freq: Two times a day (BID) | ORAL | 0 refills | Status: AC

## 2018-05-08 MED ORDER — LIDOCAINE HCL (PF) 1 % IJ SOLN
10.0000 mL | Freq: Once | INTRAMUSCULAR | Status: AC
  Administered 2018-05-08: 10 mL
  Filled 2018-05-08: qty 30

## 2018-05-08 NOTE — ED Triage Notes (Signed)
Pt reports that about 6 days ago pulled a cuticle around left thumb nail and then worked with chicken. Reports noticed swelling couple days later around nail. Reports for painful.

## 2018-05-08 NOTE — Discharge Instructions (Addendum)
Soak you finger in warm water 3 times a day. Follow up with your doctor for recheck in the next couple days. Take the medication as directed. Return here as needed for worsening symptoms.

## 2018-05-08 NOTE — ED Notes (Signed)
Pt attempting to void at this time.

## 2018-05-08 NOTE — ED Provider Notes (Signed)
[facility]-EMERGENCY DEPT Provider Note   CSN: [id number] Arrival date & time: 05/08/18  1653     History   Chief Complaint Chief Complaint  Patient presents with  . finger swelling    HPI Andrea Gill is a 28 y.o. female who presents to the ED with left thumb pain.  Patient reports that about 6 days ago she pulled a hang nail at the cuticle of the left thumb and then worked cutting chicken. Patient reports that for the past 6 days the swelling and pain has increased.   HPI  Past Medical History:  Diagnosis Date  . Obesity     There are no active problems to display for this patient.   History reviewed. No pertinent surgical history.   OB History   No obstetric history on file.      Home Medications    Prior to Admission medications   Medication Sig Start Date End Date Taking? Authorizing Provider  cephALEXin (KEFLEX) 500 MG capsule Take 1 capsule (500 mg total) by mouth 4 (four) times daily. 05/08/18   Janne NapoleonNeese, Hope M, NP  naproxen (NAPROSYN) 500 MG tablet Take 1 tablet (500 mg total) by mouth 2 (two) times daily. 05/08/18   Janne NapoleonNeese, Hope M, NP  sulfamethoxazole-trimethoprim (BACTRIM DS,SEPTRA DS) 800-160 MG tablet Take 1 tablet by mouth 2 (two) times daily for 7 days. 05/08/18 05/15/18  Janne NapoleonNeese, Hope M, NP    Family History No family history on file.  Social History Social History   Tobacco Use  . Smoking status: Never Smoker  . Smokeless tobacco: Never Used  Substance Use Topics  . Alcohol use: Yes  . Drug use: Yes    Types: Marijuana     Allergies   Apple; Banana; and Other   Review of Systems Review of Systems  HENT: Positive for congestion.   Respiratory: Positive for cough.   Musculoskeletal: Positive for arthralgias.  Skin: Positive for wound.     Physical Exam Updated Vital Signs BP 113/71   Pulse 68   Temp 98.2 F (36.8 C) (Oral)   Resp 17   LMP 04/08/2018   SpO2 97%   Physical Exam Vitals signs and nursing  note reviewed.  Constitutional:      General: She is not in acute distress.    Appearance: She is well-developed.  HENT:     Head: Normocephalic.     Nose: Congestion present.  Eyes:     Conjunctiva/sclera: Conjunctivae normal.  Neck:     Musculoskeletal: Neck supple.  Cardiovascular:     Rate and Rhythm: Normal rate.  Pulmonary:     Effort: Pulmonary effort is normal.  Musculoskeletal:     Comments: Tender swollen area to the distal aspect of the left thumb c/w paronychia.  Skin:    General: Skin is warm and dry.  Neurological:     Mental Status: She is alert and oriented to person, place, and time.  Psychiatric:        Mood and Affect: Mood normal.      ED Treatments / Results  Labs (all labs ordered are listed, but only abnormal results are displayed) Labs Reviewed  POC URINE PREG, ED    Radiology No results found.  Procedures Drain paronychia Date/Time: 05/08/2018 6:48 PM Performed by: Janne NapoleonNeese, Hope M, NP Authorized by: Janne NapoleonNeese, Hope M, NP  Consent: Verbal consent obtained. Consent given by: patient Patient understanding: patient states understanding of the procedure being performed Required items: required blood products,  implants, devices, and special equipment available Patient identity confirmed: verbally with patient Preparation: Patient was prepped and draped in the usual sterile fashion. Local anesthesia used: yes Anesthesia: digital block  Anesthesia: Local anesthesia used: yes Local Anesthetic: lidocaine 1% without epinephrine Anesthetic total: 4 mL  Sedation: Patient sedated: no  Patient tolerance: Patient tolerated the procedure well with no immediate complications Comments: Using #11 blade, incision made and drained moderate amount of purulent drainage. Wound open, no packing.     (including critical care time)  Medications Ordered in ED Medications  lidocaine (PF) (XYLOCAINE) 1 % injection 10 mL (10 mLs Infiltration Given 05/08/18 1830)    ibuprofen (ADVIL,MOTRIN) tablet 600 mg (600 mg Oral Given 05/08/18 1859)  cephALEXin (KEFLEX) capsule 500 mg (500 mg Oral Given 05/08/18 1859)     Initial Impression / Assessment and Plan / ED Course  I have reviewed the triage vital signs and the nursing notes. 28 y.o. female here with pain and swelling to the distal aspect of the left thumb stable for d/c after I&D. Will start antibiotics and have patient soak the wound. Return precautions discussed.   Final Clinical Impressions(s) / ED Diagnoses   Final diagnoses:  Acute paronychia of left thumb    ED Discharge Orders         Ordered    Apply dressing     05/08/18 1917    cephALEXin (KEFLEX) 500 MG capsule  4 times daily     05/08/18 1918    sulfamethoxazole-trimethoprim (BACTRIM DS,SEPTRA DS) 800-160 MG tablet  2 times daily     05/08/18 1918    naproxen (NAPROSYN) 500 MG tablet  2 times daily     05/08/18 1918           Kerrie Buffaloeese, Hope [place] Road[place], [place] 05/08/18 2124    Little, Ambrose Finlandachel Morgan, MD 05/09/18 0030
